# Patient Record
Sex: Female | Born: 1980 | Race: Black or African American | Hispanic: No | Marital: Single | Smoking: Current every day smoker
Health system: Southern US, Community
[De-identification: ages and names within clinical notes are randomized; demographics above are authoritative.]

## PROBLEM LIST (undated history)

## (undated) DIAGNOSIS — F909 Attention-deficit hyperactivity disorder, unspecified type: Secondary | ICD-10-CM

## (undated) DIAGNOSIS — I10 Essential (primary) hypertension: Secondary | ICD-10-CM

## (undated) DIAGNOSIS — K029 Dental caries, unspecified: Secondary | ICD-10-CM

---
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
## 1999-04-13 ENCOUNTER — Emergency Department (HOSPITAL_COMMUNITY): Admission: EM | Admit: 1999-04-13 | Discharge: 1999-04-14 | Payer: Self-pay | Admitting: Emergency Medicine

## 1999-09-01 ENCOUNTER — Emergency Department (HOSPITAL_COMMUNITY): Admission: EM | Admit: 1999-09-01 | Discharge: 1999-09-01 | Payer: Self-pay | Admitting: Emergency Medicine

## 1999-10-13 ENCOUNTER — Inpatient Hospital Stay (HOSPITAL_COMMUNITY): Admission: AD | Admit: 1999-10-13 | Discharge: 1999-10-13 | Payer: Self-pay | Admitting: *Deleted

## 1999-11-01 ENCOUNTER — Inpatient Hospital Stay (HOSPITAL_COMMUNITY): Admission: AD | Admit: 1999-11-01 | Discharge: 1999-11-01 | Payer: Self-pay | Admitting: Obstetrics and Gynecology

## 1999-11-25 ENCOUNTER — Emergency Department (HOSPITAL_COMMUNITY): Admission: EM | Admit: 1999-11-25 | Discharge: 1999-11-25 | Payer: Self-pay | Admitting: Emergency Medicine

## 1999-11-27 ENCOUNTER — Emergency Department (HOSPITAL_COMMUNITY): Admission: EM | Admit: 1999-11-27 | Discharge: 1999-11-27 | Payer: Self-pay | Admitting: Emergency Medicine

## 2000-01-09 ENCOUNTER — Emergency Department (HOSPITAL_COMMUNITY): Admission: EM | Admit: 2000-01-09 | Discharge: 2000-01-10 | Payer: Self-pay | Admitting: Emergency Medicine

## 2000-12-08 ENCOUNTER — Emergency Department (HOSPITAL_COMMUNITY): Admission: EM | Admit: 2000-12-08 | Discharge: 2000-12-08 | Payer: Self-pay | Admitting: Emergency Medicine

## 2001-01-26 ENCOUNTER — Emergency Department (HOSPITAL_COMMUNITY): Admission: EM | Admit: 2001-01-26 | Discharge: 2001-01-26 | Payer: Self-pay | Admitting: Emergency Medicine

## 2001-04-12 ENCOUNTER — Emergency Department (HOSPITAL_COMMUNITY): Admission: EM | Admit: 2001-04-12 | Discharge: 2001-04-12 | Payer: Self-pay | Admitting: *Deleted

## 2001-04-20 ENCOUNTER — Emergency Department (HOSPITAL_COMMUNITY): Admission: EM | Admit: 2001-04-20 | Discharge: 2001-04-20 | Payer: Self-pay | Admitting: Emergency Medicine

## 2002-04-04 ENCOUNTER — Emergency Department (HOSPITAL_COMMUNITY): Admission: EM | Admit: 2002-04-04 | Discharge: 2002-04-04 | Payer: Self-pay | Admitting: Emergency Medicine

## 2005-02-04 ENCOUNTER — Emergency Department (HOSPITAL_COMMUNITY): Admission: EM | Admit: 2005-02-04 | Discharge: 2005-02-04 | Payer: Self-pay | Admitting: Emergency Medicine

## 2006-02-27 ENCOUNTER — Emergency Department (HOSPITAL_COMMUNITY): Admission: EM | Admit: 2006-02-27 | Discharge: 2006-02-27 | Payer: Self-pay | Admitting: Emergency Medicine

## 2006-07-09 ENCOUNTER — Emergency Department (HOSPITAL_COMMUNITY): Admission: EM | Admit: 2006-07-09 | Discharge: 2006-07-09 | Payer: Self-pay | Admitting: Emergency Medicine

## 2006-11-23 ENCOUNTER — Inpatient Hospital Stay (HOSPITAL_COMMUNITY): Admission: AD | Admit: 2006-11-23 | Discharge: 2006-11-23 | Payer: Self-pay | Admitting: Obstetrics & Gynecology

## 2007-03-02 ENCOUNTER — Observation Stay (HOSPITAL_COMMUNITY): Admission: EM | Admit: 2007-03-02 | Discharge: 2007-03-02 | Payer: Self-pay | Admitting: Emergency Medicine

## 2008-03-13 ENCOUNTER — Inpatient Hospital Stay (HOSPITAL_COMMUNITY): Admission: AD | Admit: 2008-03-13 | Discharge: 2008-03-14 | Payer: Self-pay | Admitting: Gynecology

## 2008-03-14 ENCOUNTER — Inpatient Hospital Stay (HOSPITAL_COMMUNITY): Admission: RE | Admit: 2008-03-14 | Discharge: 2008-03-14 | Payer: Self-pay | Admitting: Obstetrics & Gynecology

## 2008-03-14 ENCOUNTER — Encounter: Payer: Self-pay | Admitting: Obstetrics & Gynecology

## 2008-04-06 ENCOUNTER — Encounter (INDEPENDENT_AMBULATORY_CARE_PROVIDER_SITE_OTHER): Payer: Self-pay | Admitting: Gynecology

## 2008-04-06 ENCOUNTER — Ambulatory Visit: Payer: Self-pay | Admitting: Obstetrics & Gynecology

## 2008-04-20 ENCOUNTER — Ambulatory Visit: Payer: Self-pay | Admitting: Obstetrics and Gynecology

## 2008-07-05 ENCOUNTER — Emergency Department (HOSPITAL_COMMUNITY): Admission: EM | Admit: 2008-07-05 | Discharge: 2008-07-05 | Payer: Self-pay | Admitting: Emergency Medicine

## 2008-12-18 ENCOUNTER — Emergency Department (HOSPITAL_COMMUNITY): Admission: EM | Admit: 2008-12-18 | Discharge: 2008-12-18 | Payer: Self-pay | Admitting: Family Medicine

## 2009-01-22 ENCOUNTER — Emergency Department (HOSPITAL_COMMUNITY): Admission: EM | Admit: 2009-01-22 | Discharge: 2009-01-22 | Payer: Self-pay | Admitting: Emergency Medicine

## 2009-09-04 ENCOUNTER — Emergency Department (HOSPITAL_COMMUNITY): Admission: EM | Admit: 2009-09-04 | Discharge: 2009-09-04 | Payer: Self-pay | Admitting: Emergency Medicine

## 2009-12-28 ENCOUNTER — Emergency Department (HOSPITAL_COMMUNITY): Admission: EM | Admit: 2009-12-28 | Discharge: 2009-12-28 | Payer: Self-pay | Admitting: Family Medicine

## 2010-01-09 ENCOUNTER — Emergency Department (HOSPITAL_COMMUNITY): Admission: EM | Admit: 2010-01-09 | Discharge: 2010-01-09 | Payer: Self-pay | Admitting: Emergency Medicine

## 2010-08-10 ENCOUNTER — Emergency Department (HOSPITAL_COMMUNITY): Admission: EM | Admit: 2010-08-10 | Discharge: 2010-08-10 | Payer: Self-pay | Admitting: Emergency Medicine

## 2010-10-03 ENCOUNTER — Emergency Department (HOSPITAL_COMMUNITY)
Admission: EM | Admit: 2010-10-03 | Discharge: 2010-10-03 | Payer: Self-pay | Source: Home / Self Care | Admitting: Emergency Medicine

## 2011-01-08 LAB — URINE MICROSCOPIC-ADD ON

## 2011-01-08 LAB — URINALYSIS, ROUTINE W REFLEX MICROSCOPIC
Bilirubin Urine: NEGATIVE
Glucose, UA: NEGATIVE mg/dL
Hgb urine dipstick: NEGATIVE
Specific Gravity, Urine: 1.017 (ref 1.005–1.030)
pH: 5.5 (ref 5.0–8.0)

## 2011-01-08 LAB — WET PREP, GENITAL
Trich, Wet Prep: NONE SEEN
Yeast Wet Prep HPF POC: NONE SEEN

## 2011-01-08 LAB — POCT PREGNANCY, URINE: Preg Test, Ur: NEGATIVE

## 2011-01-19 LAB — POCT RAPID STREP A (OFFICE): Streptococcus, Group A Screen (Direct): NEGATIVE

## 2011-03-11 NOTE — Group Therapy Note (Signed)
NAMEKAYTI, POSS NO.:  [id number]   MEDICAL RECORD NO.:  [id number]          PATIENT TYPE:  WOC   LOCATION:  [facility]                   FACILITY:  [facility]   PHYSICIAN:  Ginger Carne, MD DATE OF BIRTH:  19-May-1981   DATE OF SERVICE:  04/06/2008                                  CLINIC NOTE   Ms. Menon returns today following a maternity admission unit visit  because of a spontaneous abortion on Mar 13, 2008 under 12 weeks.  At  that time the patient had active bleeding.  Her pathology report  demonstrated significant decidua without active glands and blood and no  chorionic villi identified.   At this time the patient has stopped bleeding.  Has no specific  complaints and is here today for discussion regarding contraceptive  management and for pelvic exam.   EXTERNAL GENITALIA:  Vulva and vagina normal.  Cervix smooth without  erosions or lesions.  Uterus is small, anteverted and flexed.  Both  adnexa palpable, found to be normal.  GC, chlamydia and Pap smear  performed.   PLAN:  The patient was given information regarding a Mirena IUD, which  she has accepted to have placed.  Will be scheduled in the next 2-3  weeks for the same.  She was advised to refrain from intercourse to  avoid contraception.           ______________________________  Ginger Carne, MD     SHB/MEDQ  D:  04/06/2008  T:  04/06/2008  Job:  [id number]

## 2011-03-14 NOTE — H&P (Signed)
NAMEAIRIS, BARBEE NO.:  [id number]   MEDICAL RECORD NO.:  [id number]          PATIENT TYPE:  INP   LOCATION:  2807                         FACILITY:  [facility]   PHYSICIAN:  Armanda Magic, M.D.     DATE OF BIRTH:  1981/07/08   DATE OF ADMISSION:  03/02/2007  DATE OF DISCHARGE:                              HISTORY & PHYSICAL   No known primary care physician.  Cardiologist Corliss Marcus, MD.   REASON FOR EVALUATION:  Chest pain.   HISTORY OF PRESENT ILLNESS:  Mrs. Eades is a 30 year old African-  American female with no known history of coronary artery disease.  She  presented to the [facility] on this morning with a chief  complaint of a 1-day history of substernal nonexertional chest pain that  she described as a dull ache and constant pressure.  The pain radiated  to her bilateral arms and back.  She admitted to association shortness  of breath, nausea and vomiting x1.  There were no other associated  symptoms.  A 12-lead EKG was obtained and revealed normal sinus rhythm  with a ventricular rate of 77 beats per minute with mild ST-segment  elevation in the anterolateral leads.  She was emergently transferred to  the cardiac catheterization laboratory for further evaluation and  possible reperfusion   PAST MEDICAL HISTORY:  1. GERD.  2. Tobacco use.  3. Status post orthopedic procedure, unknown.  4. Status post cesarean section.   ALLERGIES:  NO KNOWN DRUG ALLERGIES.   CURRENT MEDICATIONS:  None.   FAMILY HISTORY:  Insignificant for early coronary artery disease.   SOCIAL HISTORY:  Single with 1 child.  Lives with her child.  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  She smokes one-half pack per day and has  been for the past 2 years.  She admits to occasional alcohol use,  however denies illicit drug use.   REVIEW OF SYSTEMS:  All other systems reviewed are negative other than  what is stated in the HPI.   GENERAL:  A 30 year old female  appearing in chest pain, NAD.  VITALS:  Pulse 88, blood pressure 127/80, respirations 21.  HEENT:  Unremarkable.  NECK: Supple without JVD or bilateral carotid bruits.  Carotid upstrokes  2+.  PULMONARY:  Breath sounds are equal and clear to auscultation  bilaterally.  No use of accessory muscles.  CV:  Regular rate and rhythm.  Normal S1 and S2 without murmurs,  gallops, clicks or rubs.  ABDOMEN: Benign.  EXTREMITIES: No peripheral edema, cyanosis, or clubbing.  DP and femoral  pulses 2+/2, bilaterally.  SKIN:  Warm and dry without rashes or lesions.  NEURO:  No focal motor or sensory deficits.  PSYCH:  Normal mood and affect.  BACK:  No kyphosis or scoliosis.   LABORATORY DATA:  White blood count 3.9, hemoglobin 12.1, hematocrit  37.6, platelets 270,000.  Sodium 136, potassium 4.2, chloride 107,  bicarbonate 21.8, BUN 7, creatinine 0.7, glucose 93, D-dimer 0.60.  Chest X-Ray: Mild chronic bronchitic changes.  Point of care enzymes:  myoglobin 56.5, CK-MB less  than 1.0, troponin I less than 0.05.   EKG as stated in the HPI.   ASSESSMENT:  1. Chest pain.  2. Dyspnea.  3. Tobacco use.   PLAN:  The patient was emergently transferred to the cardiac  catheterization laboratory for further evaluation and possible  reperfusion.  The cardiac catheterization procedure was performed by Dr.  Corliss Marcus.  The procedure, risks, and potential complications were  explained to the patient in detail by Dr. Corliss Marcus including MI,  CVA, death, IV contrast dye allergy, renal insufficiency within the  first 48 hours, and vascular complications including bleeding, hematoma  and pseudoaneurysm.  The patient admitted to understanding of the  information and wished to proceed.      Tylene Fantasia, [place]      Armanda Magic, M.D.  Electronically Signed    RDM/MEDQ  D:  03/02/2007  T:  03/02/2007  Job:  [id number]

## 2011-03-14 NOTE — Cardiovascular Report (Signed)
NAMEJENYA, Austin NO.:  [id number]   MEDICAL RECORD NO.:  [id number]          PATIENT TYPE:  INP   LOCATION:  2807                         FACILITY:  [facility]   PHYSICIAN:  Francisca December, M.D.  DATE OF BIRTH:  16-Jun-1981   DATE OF PROCEDURE:  03/02/2007  DATE OF DISCHARGE:                            CARDIAC CATHETERIZATION   PROCEDURES PERFORMED:  1. Left heart catheterization.  2. Left ventriculogram.  3. Coronary angiography.   INDICATIONS:  Diana Austin is a 30 year old woman who presented to [facility]  emergency room this morning with 0-hour history of severe chest pain.  An ECG revealed ST elevation in the lateral precordial leads.  An acute  myocardial infarction was presumed.  She is brought to the  catheterization laboratory emergently for coronary angiography and  possible PCI.   PROCEDURE NOTE:  The patient was brought to the cardiac catheterization  laboratory where the right groin was prepped and draped in the usual  sterile fashion.  Local anesthesia was obtained with infiltration of 1%  lidocaine.  A 6-French catheter sheath was inserted percutaneously into  the right femoral artery utilizing an anterior approach over guiding J-  wire.  Left heart catheterization and coronary angiography then  proceeded in standard fashion using 6-French #4 left and right Judkins  catheters and a 110 cm pigtail catheter.  A 30 RAO cine left  ventriculogram was performed utilizing a power injector.  39 cc were  injected at 13 cc per second.  The patient did develop catheter induced  spasm in the right coronary which was relieved with 0.2 mg of  intracoronary nitroglycerin.  At the completion of the procedure, the  catheter and catheter sheaths were removed.  Hemostasis was achieved by  direct pressure.  The patient was transported to the recovery area in  stable condition with an intact distal pulse.   HEMODYNAMIC RESULTS:  Systemic arterial pressure was 128/91  with a mean  of 110 mmHg.  There was no systolic gradient across the aortic valve.  Left ventricular end-diastolic pressure was 12 mmHg post ventriculogram.   ANGIOGRAPHY:  The left ventriculogram demonstrated normal chamber size  and normal global systolic function with a visually estimated ejection  fraction of 65-70%.  There were no regional wall motion abnormalities.  There was no coronary calcification.  There was no mitral vegetation.  The aortic valve is trileaflet and opens normally during systole.   There is a right-dominant coronary system present.  The main left  coronary artery is normal.   The left anterior descending artery and its branches are normal.   The left circumflex coronary artery and its branches are normal.  There  is a very large first marginal branch which is the dominant vessel on  the lateral wall of the heart.  There is a moderate-size second marginal  branch then the ongoing circumflex gives rise only to a small  posterolateral segment posterolateral branch in the basal segment of the  LV posterolateral wall.   The right coronary artery and its branches are without any significant  disease.  As mentioned, there  was some catheter induced spasm which was  eventually relieved with intracoronary nitroglycerin.  Again there is no  obstruction in the proximal, mid or distal vessel portion.  Distally the  vessel gives rise to a moderate-sized posterior descending artery and a  large posterolateral segment with a large left ventricular branch.   Collateral vessels are not seen.   FINAL IMPRESSION:  1. Intact left ventricular size and global systolic function, ejection      fraction 65 to 70%  2. Normal coronaries.  3. Noncardiac chest pain.      Francisca December, M.D.  Electronically Signed     JHE/MEDQ  D:  03/02/2007  T:  03/02/2007  Job:  [id number]

## 2011-07-23 LAB — POCT PREGNANCY, URINE
Operator id: 11897
Preg Test, Ur: POSITIVE

## 2011-07-23 LAB — CBC
HCT: 39.9
Hemoglobin: 13.4
MCHC: 33.6
MCV: 84.8
Platelets: 293
RBC: 4.52
RBC: 4.71
RDW: 18.9 — ABNORMAL HIGH
WBC: 5.5

## 2011-07-23 LAB — GC/CHLAMYDIA PROBE AMP, GENITAL
Chlamydia, DNA Probe: NEGATIVE
GC Probe Amp, Genital: NEGATIVE

## 2011-07-23 LAB — RH IMMUNE GLOBULIN WORKUP (NOT WOMEN'S HOSP)

## 2011-07-23 LAB — WET PREP, GENITAL

## 2011-07-24 LAB — POCT PREGNANCY, URINE
Operator id: 14811
Preg Test, Ur: NEGATIVE

## 2015-02-11 ENCOUNTER — Encounter (HOSPITAL_COMMUNITY): Payer: Self-pay | Admitting: Emergency Medicine

## 2015-02-11 ENCOUNTER — Emergency Department (HOSPITAL_COMMUNITY)
Admission: EM | Admit: 2015-02-11 | Discharge: 2015-02-11 | Disposition: A | Discharge status: Home / Self Care | Payer: Self-pay | Attending: Emergency Medicine | Admitting: Emergency Medicine

## 2015-02-11 DIAGNOSIS — Z720 Tobacco use: Secondary | ICD-10-CM | POA: Insufficient documentation

## 2015-02-11 DIAGNOSIS — K029 Dental caries, unspecified: Secondary | ICD-10-CM | POA: Insufficient documentation

## 2015-02-11 DIAGNOSIS — Z8659 Personal history of other mental and behavioral disorders: Secondary | ICD-10-CM | POA: Insufficient documentation

## 2015-02-11 DIAGNOSIS — I10 Essential (primary) hypertension: Secondary | ICD-10-CM | POA: Insufficient documentation

## 2015-02-11 DIAGNOSIS — K0889 Other specified disorders of teeth and supporting structures: Secondary | ICD-10-CM

## 2015-02-11 DIAGNOSIS — K088 Other specified disorders of teeth and supporting structures: Secondary | ICD-10-CM | POA: Insufficient documentation

## 2015-02-11 HISTORY — DX: Attention-deficit hyperactivity disorder, unspecified type: F90.9

## 2015-02-11 HISTORY — DX: Dental caries, unspecified: K02.9

## 2015-02-11 HISTORY — DX: Essential (primary) hypertension: I10

## 2015-02-11 MED ORDER — OXYCODONE-ACETAMINOPHEN 5-325 MG PO TABS: 2.0000 | ORAL_TABLET | ORAL | Status: AC | PRN

## 2015-02-11 MED ORDER — OXYCODONE-ACETAMINOPHEN 5-325 MG PO TABS
2.0000 | ORAL_TABLET | Freq: Once | ORAL | Status: AC
  Administered 2015-02-11: 2 via ORAL
  Filled 2015-02-11: qty 2

## 2015-02-11 NOTE — ED Notes (Signed)
C/o R sided dental pain since this morning (upper and lower).

## 2015-02-11 NOTE — ED Provider Notes (Signed)
CSN: [id number]     Arrival date & time 02/11/15  1606 History   First MD Initiated Contact with Patient 02/11/15 1801     Chief Complaint  Patient presents with  . Dental Pain     (Consider location/radiation/quality/duration/timing/severity/associated sxs/prior Treatment) Patient is a 34 y.o. female presenting with tooth pain. The history is provided by the patient. No language interpreter was used.  Dental Pain Associated symptoms: no drooling, no facial swelling and no fever   Diana Austin is a 34 y.o female who presents for gradual onset intermittent right lower tooth pain that began this morning.  She states that she placed oragel on her tooth and excederin for pain with little relief. Eating does not make it worse. She denies any fever, chills, difficulty eating, tooth injury or fracture, facial swelling, difficulty opening the jaw or jaw pain.   Past Medical History  Diagnosis Date  . Dental caries   . ADHD (attention deficit hyperactivity disorder)   . Hypertension    History reviewed. No pertinent past surgical history. No family history on file. History  Substance Use Topics  . Smoking status: Current Every Day Smoker  . Smokeless tobacco: Not on file  . Alcohol Use: No   OB History    No data available     Review of Systems  Constitutional: Negative for fever and activity change.  HENT: Positive for dental problem. Negative for drooling, ear pain, facial swelling, trouble swallowing and voice change.   Eyes: Negative for pain.  Skin: Negative for wound.      Allergies  Review of patient's allergies indicates not on file.  Home Medications   Prior to Admission medications   Medication Sig Start Date End Date Taking? Authorizing Provider  oxyCODONE-acetaminophen (PERCOCET/ROXICET) 5-325 MG per tablet Take 2 tablets by mouth every 4 (four) hours as needed for severe pain. 02/11/15   Chrstopher Malenfant Patel-Mills, PA-C   BP 159/110 mmHg  Pulse 65  Temp(Src) 98 F (36.7  C) (Oral)  Resp 20  Ht  (1.676 m)  Wt 267 lb (121.11 kg)  BMI 43.12 kg/m2  SpO2 100%  LMP 12/08/2014 Physical Exam  Constitutional: She is oriented to person, place, and time. She appears well-developed and well-nourished.  HENT:  No dental abscess. She does have several teeth with dental decay and caries but no obvious exposed pulp or dentin including #30 which is the tooth that is causing her pain.  No signs of trismus.  No difficulty opening and closing jaw.    Neck: Normal range of motion. Neck supple.  Cardiovascular: Normal rate, regular rhythm and normal heart sounds.   Pulmonary/Chest: Effort normal and breath sounds normal.  Neurological: She is alert and oriented to person, place, and time.  Skin: Skin is warm and dry.  Nursing note and vitals reviewed.   ED Course  Procedures (including critical care time) Labs Review Labs Reviewed - No data to display  Imaging Review No results found.   EKG Interpretation None      MDM   Final diagnoses:  Pain, dental  Patient presents for dental pain. She has several dental caries and tooth decay but no abscess or injury.  She has no facial swelling and she is able to eat and drink. Patient has not been to ED for dental pain in the past. I think it is reasonable to give her eight percocet for pain until she follows up tomorrow.  I did give her dental referral using the dentist  on call or the resource guide and she agrees with the plan.     Catha GosselinHanna Patel-Mills, PA-C 02/11/15 1914  Linwood DibblesJon Knapp, MD 02/14/15 [phone]

## 2015-02-11 NOTE — Discharge Instructions (Signed)
Dental Pain °A tooth ache may be caused by cavities (tooth decay). Cavities expose the nerve of the tooth to air and hot or cold temperatures. It may come from an infection or abscess (also called a boil or furuncle) around your tooth. It is also often caused by dental caries (tooth decay). This causes the pain you are having. °DIAGNOSIS  °Your caregiver can diagnose this problem by exam. °TREATMENT  °· If caused by an infection, it may be treated with medications which kill germs (antibiotics) and pain medications as prescribed by your caregiver. Take medications as directed. °· Only take over-the-counter or prescription medicines for pain, discomfort, or fever as directed by your caregiver. °· Whether the tooth ache today is caused by infection or dental disease, you should see your dentist as soon as possible for further care. °SEEK MEDICAL CARE IF: °The exam and treatment you received today has been provided on an emergency basis only. This is not a substitute for complete medical or dental care. If your problem worsens or new problems (symptoms) appear, and you are unable to meet with your dentist, call or return to this location. °SEEK IMMEDIATE MEDICAL CARE IF:  °· You have a fever. °· You develop redness and swelling of your face, jaw, or neck. °· You are unable to open your mouth. °· You have severe pain uncontrolled by pain medicine. °MAKE SURE YOU:  °· Understand these instructions. °· Will watch your condition. °· Will get help right away if you are not doing well or get worse. °Document Released: 10/13/2005 Document Revised: 01/05/2012 Document Reviewed: 05/31/2008 °ExitCare® Patient Information ©2015 [employer]. This information is not intended to replace advice given to you by your health care provider. Make sure you discuss any questions you have with your health care provider. ° °Emergency Department Resource Guide °1) Find a Doctor and Pay Out of Pocket °Although you won't have to find out who  is covered by your insurance plan, it is a good idea to ask around and get recommendations. You will then need to call the office and see if the doctor you have chosen will accept you as a new patient and what types of options they offer for patients who are self-pay. Some doctors offer discounts or will set up payment plans for their patients who do not have insurance, but you will need to ask so you aren't surprised when you get to your appointment. ° °2) Contact Your Local Health Department °Not all health departments have doctors that can see patients for sick visits, but many do, so it is worth a call to see if yours does. If you don't know where your local health department is, you can check in your phone book. The CDC also has a tool to help you locate your state's health department, and many state websites also have listings of all of their local health departments. ° °3) Find a Walk-in Clinic °If your illness is not likely to be very severe or complicated, you may want to try a walk in clinic. These are popping up all over the country in pharmacies, drugstores, and shopping centers. They're usually staffed by nurse practitioners or physician assistants that have been trained to treat common illnesses and complaints. They're usually fairly quick and inexpensive. However, if you have serious medical issues or chronic medical problems, these are probably not your best option. ° °No Primary Care Doctor: °- Call [facility] at  832-8000 - they can help you locate a primary   care doctor that  accepts your insurance, provides certain services, etc. °- Physician Referral Service- 1-800-533-3463 ° °Chronic Pain Problems: °Organization         Address  Phone   Notes  °[facility]  (336) 297-2271 Patients need to be referred by their primary care doctor.  ° °Medication Assistance: °Organization         Address  Phone   Notes  °[place] [facility] [address]  [address] °[place], [place] [address] (336) 641-8030 --Must be a resident of [place] °-- Must have NO insurance coverage whatsoever (no Medicaid/ Medicare, etc.) °-- The pt. MUST have a primary care doctor that directs their care regularly and follows them in the community °  °[facility]  (866) 331-1348   °[employer]  (888) 892-1162   ° °Agencies that provide inexpensive medical care: °Organization         Address  Phone   Notes  °[facility]  (336) 832-8035   °[facility]    (336) 832-7272   °[facility] [address] °[place], [place] [address] (336) 832-4777   °[facility] [address], °[place] (336) 271-4999   °[facility]    (336) 373-0678   °[facility]    (336) 272-1050   °[facility] ° [address], [place] Phone:  (336) 832-4444, Fax:  (336) 832-4440 Hours of Operation:  9 am - 6 pm, M-F.  Also accepts Medicaid/Medicare and self-pay.  °[facility] ° [address], [place] Phone: (336) 832-3150, Fax: (336) [phone]. Hours of Operation:  8:30 am - 5:30 pm, M-F.  Also accepts Medicaid and self-pay.  °[facility] [place] [address], [place] Phone: (336) 878-6027   °[facility] [address], [place], [place] (336)723-1848, Ext. 123 Mondays & Thursdays: 7-9 AM.  First 15 patients are seen on a first come, first serve basis. °  ° °Medicaid-accepting [place] Providers: ° °Organization         Address  Phone   Notes  °[facility] [address], [place] (336) 641-2100 Also accepts self-pay patients.  °[facility] [address], [place] ° (336) 856-9996   °[facility] [address], [place] (336) 288-8857   °[facility] [address] [place] [address], [place] (336) 299-7000   °Veita Bland [address], [place]  ° (336) 373-1557 Only accepts [place] Access Medicaid patients after they have their name applied to their card.  ° °Self-Pay (no insurance) in [place]: ° °Organization         Address  Phone   Notes  °Sickle Cell Patients, [facility] [address], [place] (336) 832-1970   °[facility] [address], [place] (336) 832-4400   ° [facility] ° [address], [place] (336) 992-4800   °[facility]/Dr. Osei-Bonsu ° [address] [place] [address], [place] or [address], [place] (336) [phone] Phone number for both [place] and [place] locations is the same.  °[facility] [address], [place] (336) 299-0000   °[facility] [address] [place] [address], [place] or [address] (336) 852-7530 °(336) 878-2260   °  [facility] [address], [place] (336) 350-1642, phone; (336) 294-5005, fax Sees patients 1st and 3rd Saturday of every month.  Must not qualify for public or private insurance (i.e. Medicaid, Medicare, [place] Health Choice, Veterans' Benefits) • Household income should be no more than 200% of the poverty level •The clinic cannot treat you if you are pregnant or think you are pregnant • Sexually transmitted diseases are not treated at the clinic.  ° ° °Dental Care: °Organization         Address  Phone  Notes  °[facility] [address], [place] (336) 641-6152 Accepts children up to age 21 who are enrolled in Medicaid or [place] Health Choice; pregnant women with a Medicaid card; and children who have applied for Medicaid or [place] Health Choice, but were declined, whose parents can pay a reduced fee at time of service.  °[facility] [place]  [address], [place] (336) 641-7733 Accepts children up to age 21 who are  enrolled in Medicaid or [place] Health Choice; pregnant women with a Medicaid card; and children who have applied for Medicaid or [place] Health Choice, but were declined, whose parents can pay a reduced fee at time of service.  °[facility] ° [address], [place] (336) 641-4533 Patients are seen by appointment only. Walk-ins are not accepted. [facility] will see patients 18 years of age and older. °Monday - Tuesday (8am-5pm) °Most Wednesdays (8:30-5pm) °$30 per visit, cash only  °[facility] ° [address], [place] (336) 641-4533 Patients are seen by appointment only. Walk-ins are not accepted. [facility] will see patients 18 years of age and older. °One Wednesday Evening (Monthly: Volunteer Based).  $30 per visit, cash only  °[facility]  (919) [phone] for adults; Children under age 4, call Graduate Pediatric Dentistry at ([phone]) [phone]. Children aged 4-14, please call ([phone]) [phone] to request a pediatric application. ° Dental services are provided in all areas of dental care including fillings, crowns and bridges, complete and partial dentures, implants, gum treatment, root canals, and extractions. Preventive care is also provided. Treatment is provided to both adults and children. °Patients are selected via a lottery and there is often a waiting list. °  °[facility] [address], °[place] ° (336) 763-8833 [email or web address] °  °[facility] [address], [place], [place] (336)723-1848, Ext. 123 Second and Fourth Thursday of each month, opens at 6:30 AM; Clinic ends at 9 AM.  Patients are seen on a first-come first-served basis, and a limited number are seen during each clinic.  ° °[facility] ° [address], [place], [place] (336) 723-7904   Eligibility Requirements °You must have lived in [place], [place], or [place] counties for at least the last three months. °  You  cannot be eligible for state or federal sponsored healthcare insurance, including Veterans Administration, Medicaid, or Medicare. °  You generally cannot be eligible for healthcare insurance through your employer.  °  How to apply: °Eligibility screenings are held every Tuesday and Wednesday afternoon from 1:00 pm until 4:00 pm. You do not need an appointment for the interview!  °[facility] [address], [place],  336-631-2330   °[facility]  336-342-8273   °[facility]  336-703-3100   °[facility]  336-570-6415   ° °Behavioral Health Resources in the Community: °Intensive Outpatient Programs °Organization         Address  Phone  Notes  °[facility] [address], [place], [place] 336-878-6098   °[facility] [address], [place], [place] 336-832-9800   °[facility] [address], [place], [place] ° 336-882-2125   °[facility] [address],  °[place], [place] 1-800-853-5163 or 336-641-4981   °Substance Abuse Resources °Organization         Address  Phone  Notes  °[facility]  336-882-2125   °[facility]  336-784-9470   °[facility]  336-285-9073   °[facility]  336-845-3988   °Residential & Outpatient Substance Abuse Program  1-800-659-3381   °Psychological Services °Organization         Address  Phone  Notes  °[facility]  336- 832-9600   °[facility]  336- 378-7881   °[facility] [address], [place] 1-800-853-5163 or 336-641-4981   ° °Mobile Crisis Teams °Organization         Address  Phone  Notes  °[facility], Mobile Crisis Care Unit  1-877-626-1772   °Assertive °[facility] ° [address] [place], [place] 336-834-9664   °Sharon DeEsch [address] °[place] 336-554-5454   ° °Self-Help/Support  Groups °Organization         Address  Phone             Notes  °[facility] - variety of support groups  336- 373-1402 Call for more information  °Narcotics Anonymous (NA), [facility] [address], °[place]  2 meetings at this location  ° °Residential Treatment Programs °Organization         Address  Phone  Notes  °[facility] [address],    °[place]  1-866-801-8205   °[facility] ° [address], [place], [place] 704-293-8524   °[facility] [address], [place] 336-845-3988 Admissions: 8am-3pm M-F  °[facility] [address]    °[place], [place] 336-841-1104   °[facility] [address], [place], [place] 336-379-7146   °[facility] [address]  °[place], [place] 336-285-9073   °[facility] - Intensive Outpatient [address], [place], [place] 336-852-3033   °[facility] [address]  °[place], [place] 1-877-615-2722 or 336-784-9470   °[facility] [address] [place], [place] 336-227-7417 Accepts Medicaid  °[facility] [address]  ° [place] 1-800-659-3381 Substance Abuse/Addiction Treatment  ° °[place] Behavioral Health Resources °Organization         Address  Phone  Notes  °[facility]  (888) 581-9988   °Julie Brannon, PhD [address] [place], [place]   (336) 349-5553 or (336) 951-0000   °[facility]   [address] °[place], [place] (336) 349-4454   °[facility] [address], [place], [place] (336) 342-8316 Insurance/Medicaid/sponsorship through [facility]  °[facility] [address]                                    [place], [place] (336) 342-8316 Therapy/tele-psych/case  °[facility]   [address]  ° [place], [place] (336) 349-2233    °Dr. Arfeen  (336) 349-4544   °[facility] [place]  [employer] [facility] [place] [facility] 1) [address], [place] °2) [address], [place] °3)  [address], [place] (336) 349-3220 °(336) 342-7768 ° °(336) 342-8140   °[place] [facility] (336) 342-1394 or (336) 342-3537 (After Hours)    ° ° ° °

## 2015-10-03 ENCOUNTER — Emergency Department (HOSPITAL_COMMUNITY)
Admission: EM | Admit: 2015-10-03 | Discharge: 2015-10-03 | Disposition: A | Discharge status: Home / Self Care | Payer: No Typology Code available for payment source | Attending: Emergency Medicine | Admitting: Emergency Medicine

## 2015-10-03 DIAGNOSIS — Y9389 Activity, other specified: Secondary | ICD-10-CM | POA: Diagnosis not present

## 2015-10-03 DIAGNOSIS — S4992XA Unspecified injury of left shoulder and upper arm, initial encounter: Secondary | ICD-10-CM | POA: Insufficient documentation

## 2015-10-03 DIAGNOSIS — Y998 Other external cause status: Secondary | ICD-10-CM | POA: Insufficient documentation

## 2015-10-03 DIAGNOSIS — M25511 Pain in right shoulder: Secondary | ICD-10-CM

## 2015-10-03 DIAGNOSIS — S8992XA Unspecified injury of left lower leg, initial encounter: Secondary | ICD-10-CM | POA: Diagnosis not present

## 2015-10-03 DIAGNOSIS — I10 Essential (primary) hypertension: Secondary | ICD-10-CM | POA: Insufficient documentation

## 2015-10-03 DIAGNOSIS — M25512 Pain in left shoulder: Secondary | ICD-10-CM

## 2015-10-03 DIAGNOSIS — Z8659 Personal history of other mental and behavioral disorders: Secondary | ICD-10-CM | POA: Insufficient documentation

## 2015-10-03 DIAGNOSIS — Y92410 Unspecified street and highway as the place of occurrence of the external cause: Secondary | ICD-10-CM | POA: Insufficient documentation

## 2015-10-03 DIAGNOSIS — F17200 Nicotine dependence, unspecified, uncomplicated: Secondary | ICD-10-CM | POA: Diagnosis not present

## 2015-10-03 DIAGNOSIS — M79602 Pain in left arm: Secondary | ICD-10-CM

## 2015-10-03 DIAGNOSIS — S199XXA Unspecified injury of neck, initial encounter: Secondary | ICD-10-CM | POA: Diagnosis not present

## 2015-10-03 DIAGNOSIS — S4991XA Unspecified injury of right shoulder and upper arm, initial encounter: Secondary | ICD-10-CM | POA: Insufficient documentation

## 2015-10-03 DIAGNOSIS — Z8719 Personal history of other diseases of the digestive system: Secondary | ICD-10-CM | POA: Insufficient documentation

## 2015-10-03 MED ORDER — METHOCARBAMOL 500 MG PO TABS: 500.0000 mg | ORAL_TABLET | Freq: Two times a day (BID) | ORAL | Status: AC | PRN

## 2015-10-03 MED ORDER — ACETAMINOPHEN 325 MG PO TABS
650.0000 mg | ORAL_TABLET | Freq: Once | ORAL | Status: AC
  Administered 2015-10-03: 650 mg via ORAL
  Filled 2015-10-03: qty 2

## 2015-10-03 MED ORDER — NAPROXEN 250 MG PO TABS: 250.0000 mg | ORAL_TABLET | Freq: Two times a day (BID) | ORAL | Status: AC
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
## 2015-10-03 NOTE — ED Notes (Signed)
Declined W/C at D/C and was escorted to lobby by RN. 

## 2016-05-21 ENCOUNTER — Encounter (HOSPITAL_COMMUNITY): Payer: Self-pay | Admitting: Emergency Medicine

## 2016-05-21 ENCOUNTER — Emergency Department (HOSPITAL_COMMUNITY)
Admission: EM | Admit: 2016-05-21 | Discharge: 2016-05-21 | Disposition: A | Discharge status: Home / Self Care | Payer: Managed Care, Other (non HMO) | Attending: Emergency Medicine | Admitting: Emergency Medicine

## 2016-05-21 DIAGNOSIS — F17200 Nicotine dependence, unspecified, uncomplicated: Secondary | ICD-10-CM | POA: Insufficient documentation

## 2016-05-21 DIAGNOSIS — I10 Essential (primary) hypertension: Secondary | ICD-10-CM | POA: Insufficient documentation

## 2016-05-21 DIAGNOSIS — R51 Headache: Secondary | ICD-10-CM | POA: Insufficient documentation

## 2016-05-21 DIAGNOSIS — R519 Headache, unspecified: Secondary | ICD-10-CM

## 2016-05-21 LAB — COMPREHENSIVE METABOLIC PANEL
ALBUMIN: 3.5 g/dL (ref 3.5–5.0)
ALK PHOS: 82 U/L (ref 38–126)
ALT: 19 U/L (ref 14–54)
ANION GAP: 5 (ref 5–15)
AST: 20 U/L (ref 15–41)
BILIRUBIN TOTAL: 0.4 mg/dL (ref 0.3–1.2)
BUN: 5 mg/dL — AB (ref 6–20)
CO2: 24 mmol/L (ref 22–32)
CREATININE: 0.88 mg/dL (ref 0.44–1.00)
Calcium: 9.1 mg/dL (ref 8.9–10.3)
Chloride: 106 mmol/L (ref 101–111)
GLUCOSE: 106 mg/dL — AB (ref 65–99)
POTASSIUM: 3.8 mmol/L (ref 3.5–5.1)
Sodium: 135 mmol/L (ref 135–145)
Total Protein: 6.9 g/dL (ref 6.5–8.1)

## 2016-05-21 LAB — CBC WITH DIFFERENTIAL/PLATELET
BASOS PCT: 0 %
Basophils Absolute: 0 10*3/uL (ref 0.0–0.1)
Eosinophils Absolute: 0.2 10*3/uL (ref 0.0–0.7)
Eosinophils Relative: 5 %
HEMATOCRIT: 43 % (ref 36.0–46.0)
HEMOGLOBIN: 14.1 g/dL (ref 12.0–15.0)
LYMPHS ABS: 1.7 10*3/uL (ref 0.7–4.0)
Lymphocytes Relative: 38 %
MCH: 28.7 pg (ref 26.0–34.0)
MCHC: 32.8 g/dL (ref 30.0–36.0)
MCV: 87.4 fL (ref 78.0–100.0)
MONO ABS: 0.3 10*3/uL (ref 0.1–1.0)
MONOS PCT: 6 %
NEUTROS ABS: 2.4 10*3/uL (ref 1.7–7.7)
NEUTROS PCT: 51 %
Platelets: 366 10*3/uL (ref 150–400)
RBC: 4.92 MIL/uL (ref 3.87–5.11)
RDW: 14 % (ref 11.5–15.5)
WBC: 4.6 10*3/uL (ref 4.0–10.5)

## 2016-05-21 MED ORDER — OXYCODONE-ACETAMINOPHEN 5-325 MG PO TABS
ORAL_TABLET | ORAL | Status: AC
  Filled 2016-05-21: qty 1

## 2016-05-21 MED ORDER — OXYCODONE-ACETAMINOPHEN 5-325 MG PO TABS
1.0000 | ORAL_TABLET | Freq: Once | ORAL | Status: AC
  Administered 2016-05-21: 1 via ORAL

## 2016-05-21 NOTE — ED Triage Notes (Signed)
Pt. Stated, yesterday I started having a headache with dizziness, and pain behind my eyes. I have high BP and its probably that.

## 2016-05-21 NOTE — ED Provider Notes (Signed)
[facility]-EMERGENCY DEPT Provider Note   CSN: [id number] Arrival date & time: 05/21/16  1409  First Provider Contact:  First MD Initiated Contact with Patient 05/21/16 1659        History   Chief Complaint Chief Complaint  Patient presents with  . Headache  . Weakness  . Dizziness    HPI Diana Austin is a 35 y.o. female.  35 yo F with CC of a headache.  Hx of migraines, feels similar.  Slow in onset. Given a percocet in triage with complete relief of symptoms. Having some tingling to hands. Has hx of similar with headaches.    The history is provided by the patient.  Headache   This is a recurrent problem. The current episode started yesterday. The problem occurs constantly. The problem has not changed since onset.The headache is associated with bright light and loud noise. The pain is located in the left unilateral region. The quality of the pain is described as sharp. The pain is at a severity of 10/10. The patient is experiencing no pain. The pain radiates to the face. Pertinent negatives include no fever, no palpitations, no shortness of breath, no nausea and no vomiting. She has tried acetaminophen for the symptoms.  Weakness  Associated symptoms include headaches. Pertinent negatives include no chest pain and no shortness of breath.  Dizziness  Associated symptoms: headaches and weakness   Associated symptoms: no chest pain, no nausea, no palpitations, no shortness of breath and no vomiting     Past Medical History:  Diagnosis Date  . ADHD (attention deficit hyperactivity disorder)   . Dental caries   . Hypertension     There are no active problems to display for this patient.   History reviewed. No pertinent surgical history.  OB History    No data available       Home Medications    Prior to Admission medications   Medication Sig Start Date End Date Taking? Authorizing Provider  naproxen (NAPROSYN) 250 MG tablet Take 1 tablet (250 mg total) by mouth 2  (two) times daily with a meal. 10/03/15  Yes Everlene Farrier, PA-C  methocarbamol (ROBAXIN) 500 MG tablet Take 1 tablet (500 mg total) by mouth 2 (two) times daily as needed for muscle spasms. 10/03/15   Everlene Farrier, PA-C  oxyCODONE-acetaminophen (PERCOCET/ROXICET) 5-325 MG per tablet Take 2 tablets by mouth every 4 (four) hours as needed for severe pain. 02/11/15   Catha Gosselin, PA-C    Family History No family history on file.  Social History Social History  Substance Use Topics  . Smoking status: Current Every Day Smoker  . Smokeless tobacco: Current User  . Alcohol use No     Allergies   Review of patient's allergies indicates no known allergies.   Review of Systems Review of Systems  Constitutional: Negative for chills and fever.  HENT: Negative for congestion and rhinorrhea.   Eyes: Negative for redness and visual disturbance.  Respiratory: Negative for shortness of breath and wheezing.   Cardiovascular: Negative for chest pain and palpitations.  Gastrointestinal: Negative for nausea and vomiting.  Genitourinary: Negative for dysuria and urgency.  Musculoskeletal: Negative for arthralgias and myalgias.  Skin: Negative for pallor and wound.  Neurological: Positive for dizziness, weakness and headaches.     Physical Exam Updated Vital Signs BP (!) 145/104 (BP Location: Right Arm)   Pulse 66   Temp 98.9 F (37.2 C) (Oral)   Resp 16   Ht  (1.676 m)  Wt 234 lb 4 oz (106.3 kg)   LMP 05/09/2016   SpO2 100%   BMI 37.81 kg/m   Physical Exam  Constitutional: She is oriented to person, place, and time. She appears well-developed and well-nourished. No distress.  HENT:  Head: Normocephalic and atraumatic.  Eyes: EOM are normal. Pupils are equal, round, and reactive to light.  Neck: Normal range of motion. Neck supple.  Cardiovascular: Normal rate and regular rhythm.  Exam reveals no gallop and no friction rub.   No murmur heard. Pulmonary/Chest: Effort  normal. She has no wheezes. She has no rales.  Abdominal: Soft. She exhibits no distension. There is no tenderness.  Musculoskeletal: She exhibits no edema or tenderness.  Neurological: She is alert and oriented to person, place, and time. She has normal strength. No cranial nerve deficit or sensory deficit. She displays a negative Romberg sign. GCS eye subscore is 4. GCS verbal subscore is 5. GCS motor subscore is 6. She displays no Babinski's sign on the right side. She displays no Babinski's sign on the left side.  Reflex Scores:      Tricep reflexes are 2+ on the right side and 2+ on the left side.      Bicep reflexes are 2+ on the right side and 2+ on the left side.      Brachioradialis reflexes are 2+ on the right side and 2+ on the left side.      Patellar reflexes are 2+ on the right side and 2+ on the left side.      Achilles reflexes are 2+ on the right side and 2+ on the left side. Benign neuro exam  Skin: Skin is warm and dry. She is not diaphoretic.  Psychiatric: She has a normal mood and affect. Her behavior is normal.  Nursing note and vitals reviewed.    ED Treatments / Results  Labs (all labs ordered are listed, but only abnormal results are displayed) Labs Reviewed  COMPREHENSIVE METABOLIC PANEL - Abnormal; Notable for the following:       Result Value   Glucose, Bld 106 (*)    BUN 5 (*)    All other components within normal limits  CBC WITH DIFFERENTIAL/PLATELET    EKG  EKG Interpretation None       Radiology No results found.  Procedures Procedures (including critical care time)  Medications Ordered in ED Medications  oxyCODONE-acetaminophen (PERCOCET/ROXICET) 5-325 MG per tablet 1 tablet (1 tablet Oral Given 05/21/16 1443)     Initial Impression / Assessment and Plan / ED Course  I have reviewed the triage vital signs and the nursing notes.  Pertinent labs & imaging results that were available during my care of the patient were reviewed by me and  considered in my medical decision making (see chart for details).  Clinical Course    35 yo F with headache, similar to prior. The patient was given a Percocet with complete resolution of symptoms. She also had some tingling in her hands that has resolved. Benign neuro exam. Discharge home.  10:29 PM:  I have discussed the diagnosis/risks/treatment options with the patient and believe the pt to be eligible for discharge home to follow-up with PCP. We also discussed returning to the ED immediately if new or worsening sx occur. We discussed the sx which are most concerning (e.g., sudden worsening pain, fever, inability to tolerate by mouth) that necessitate immediate return. Medications administered to the patient during their visit and any new prescriptions provided to the  patient are listed below.  Medications given during this visit Medications  oxyCODONE-acetaminophen (PERCOCET/ROXICET) 5-325 MG per tablet 1 tablet (1 tablet Oral Given 05/21/16 1443)    Discharge Medication List as of 05/21/2016  5:42 PM      The patient appears reasonably screen and/or stabilized for discharge and I doubt any other medical condition or other [facility] requiring further screening, evaluation, or treatment in the ED at this time prior to discharge.    Final Clinical Impressions(s) / ED Diagnoses   Final diagnoses:  Acute nonintractable headache, unspecified headache type    New Prescriptions Discharge Medication List as of 05/21/2016  5:42 PM       Melene Plan, DO 05/21/16 2229

## 2016-05-21 NOTE — Discharge Instructions (Signed)
Follow up with your doctor tomorrow.

## 2016-12-09 ENCOUNTER — Encounter (HOSPITAL_COMMUNITY): Payer: Self-pay

## 2016-12-09 ENCOUNTER — Emergency Department (HOSPITAL_COMMUNITY)
Admission: EM | Admit: 2016-12-09 | Discharge: 2016-12-09 | Disposition: A | Discharge status: Home / Self Care | Payer: 59 | Attending: Emergency Medicine | Admitting: Emergency Medicine

## 2016-12-09 ENCOUNTER — Emergency Department (HOSPITAL_COMMUNITY): Payer: 59

## 2016-12-09 DIAGNOSIS — F909 Attention-deficit hyperactivity disorder, unspecified type: Secondary | ICD-10-CM | POA: Diagnosis not present

## 2016-12-09 DIAGNOSIS — I10 Essential (primary) hypertension: Secondary | ICD-10-CM | POA: Insufficient documentation

## 2016-12-09 DIAGNOSIS — R69 Illness, unspecified: Secondary | ICD-10-CM

## 2016-12-09 DIAGNOSIS — Z79899 Other long term (current) drug therapy: Secondary | ICD-10-CM | POA: Diagnosis not present

## 2016-12-09 DIAGNOSIS — E876 Hypokalemia: Secondary | ICD-10-CM | POA: Diagnosis not present

## 2016-12-09 DIAGNOSIS — F17200 Nicotine dependence, unspecified, uncomplicated: Secondary | ICD-10-CM | POA: Insufficient documentation

## 2016-12-09 DIAGNOSIS — J111 Influenza due to unidentified influenza virus with other respiratory manifestations: Secondary | ICD-10-CM | POA: Insufficient documentation

## 2016-12-09 DIAGNOSIS — R05 Cough: Secondary | ICD-10-CM | POA: Diagnosis present

## 2016-12-09 LAB — I-STAT CHEM 8, ED
BUN: 7 mg/dL (ref 6–20)
CALCIUM ION: 1.11 mmol/L — AB (ref 1.15–1.40)
CREATININE: 0.8 mg/dL (ref 0.44–1.00)
Chloride: 100 mmol/L — ABNORMAL LOW (ref 101–111)
GLUCOSE: 127 mg/dL — AB (ref 65–99)
HCT: 35 % — ABNORMAL LOW (ref 36.0–46.0)
Hemoglobin: 11.9 g/dL — ABNORMAL LOW (ref 12.0–15.0)
Potassium: 2.7 mmol/L — CL (ref 3.5–5.1)
Sodium: 140 mmol/L (ref 135–145)
TCO2: 25 mmol/L (ref 0–100)

## 2016-12-09 LAB — BASIC METABOLIC PANEL
ANION GAP: 8 (ref 5–15)
BUN: 7 mg/dL (ref 6–20)
CO2: 27 mmol/L (ref 22–32)
Calcium: 9.1 mg/dL (ref 8.9–10.3)
Chloride: 104 mmol/L (ref 101–111)
Creatinine, Ser: 0.83 mg/dL (ref 0.44–1.00)
GFR calc Af Amer: >60 mL/min (ref >60)
GLUCOSE: 96 mg/dL (ref 65–99)
POTASSIUM: 2.9 mmol/L — AB (ref 3.5–5.1)
Sodium: 139 mmol/L (ref 135–145)

## 2016-12-09 LAB — I-STAT TROPONIN, ED: Troponin i, poc: 0 ng/mL (ref 0.00–0.08)

## 2016-12-09 MED ORDER — ACETAMINOPHEN 325 MG PO TABS
ORAL_TABLET | ORAL | Status: AC
  Filled 2016-12-09: qty 2

## 2016-12-09 MED ORDER — POTASSIUM CHLORIDE CRYS ER 20 MEQ PO TBCR
40.0000 meq | EXTENDED_RELEASE_TABLET | Freq: Once | ORAL | Status: AC
  Administered 2016-12-09: 40 meq via ORAL
  Filled 2016-12-09: qty 2

## 2016-12-09 MED ORDER — POTASSIUM CHLORIDE ER 10 MEQ PO TBCR: 20.0000 meq | EXTENDED_RELEASE_TABLET | Freq: Two times a day (BID) | ORAL | 0 refills | Status: AC

## 2016-12-09 MED ORDER — ACETAMINOPHEN 325 MG PO TABS
650.0000 mg | ORAL_TABLET | Freq: Once | ORAL | Status: AC
  Administered 2016-12-09: 650 mg via ORAL

## 2016-12-09 NOTE — ED Provider Notes (Signed)
[facility]PT Provider Note   CSN: [id number] Arrival date & time: 12/09/16  1222  By signing my name below, I, Sonum Patel, attest that this documentation has been prepared under the direction and in the presence of Alvira Monday, MD. Electronically Signed: Sonum Patel, Neurosurgeon. 12/09/16. 6:33 PM.  History   Chief Complaint Chief Complaint  Patient presents with  . Cough  . body pain    The history is provided by the patient. No language interpreter was used.     HPI Comments: Diana Austin is a 36 y.o. female who presents to the Emergency Department complaining of a gradual onset, persistent, gradually worsening non-productive cough with associated sore throat, ear pain, mild fever, chills, and SOB that began 1 day ago. She tried cough drops, Nyquil, and hot toddy without relief. She notes CP that is present with coughing fits. She denies lower leg pain, lower leg swelling, nausea, vomiting, diarrhea, abdominal pain, HA.  Reports myalgias.   Past Medical History:  Diagnosis Date  . ADHD (attention deficit hyperactivity disorder)   . Dental caries   . Hypertension     There are no active problems to display for this patient.   History reviewed. No pertinent surgical history.  OB History    No data available       Home Medications    Prior to Admission medications   Medication Sig Start Date End Date Taking? Authorizing Provider  methocarbamol (ROBAXIN) 500 MG tablet Take 1 tablet (500 mg total) by mouth 2 (two) times daily as needed for muscle spasms. 10/03/15   Everlene Farrier, PA-C  naproxen (NAPROSYN) 250 MG tablet Take 1 tablet (250 mg total) by mouth 2 (two) times daily with a meal. 10/03/15   Everlene Farrier, PA-C  oxyCODONE-acetaminophen (PERCOCET/ROXICET) 5-325 MG per tablet Take 2 tablets by mouth every 4 (four) hours as needed for severe pain. 02/11/15   Hanna Patel-Mills, PA-C  potassium chloride (K-DUR) 10 MEQ tablet Take 2 tablets (20 mEq total) by mouth  2 (two) times daily. Take (4 tablets) tonight, followed by 2 tablets twice per day for 1 day. 12/09/16 12/11/16  Alvira Monday, MD    Family History No family history on file.  Social History Social History  Substance Use Topics  . Smoking status: Current Every Day Smoker  . Smokeless tobacco: Current User  . Alcohol use No     Allergies   Patient has no known allergies.   Review of Systems Review of Systems  Constitutional: Positive for chills and fever.  HENT: Positive for ear pain and sore throat.   Eyes: Negative for visual disturbance.  Respiratory: Positive for cough and shortness of breath.   Cardiovascular: Positive for chest pain. Negative for leg swelling.  Gastrointestinal: Negative for abdominal pain, diarrhea, nausea and vomiting.  Genitourinary: Negative for difficulty urinating.  Musculoskeletal: Positive for myalgias. Negative for back pain and neck pain.  Skin: Negative for rash.  Neurological: Negative for syncope and headaches.     Physical Exam Updated Vital Signs BP 122/87 (BP Location: Right Arm)   Pulse 87   Temp 100.5 F (38.1 C) (Oral)   Resp 16   LMP 11/25/2016   SpO2 100%   Physical Exam  Constitutional: She is oriented to person, place, and time. She appears well-developed and well-nourished. No distress.  HENT:  Head: Normocephalic and atraumatic.  Right Ear: External ear normal.  Left Ear: External ear normal.  Nose: Nose normal.  Mouth/Throat: Oropharynx is clear and  moist. No oropharyngeal exudate.  Eyes: Conjunctivae and EOM are normal. Pupils are equal, round, and reactive to light.  Neck: Normal range of motion. Neck supple.  Cardiovascular: Normal rate, regular rhythm, normal heart sounds and intact distal pulses.  Exam reveals no gallop and no friction rub.   No murmur heard. Pulmonary/Chest: Effort normal and breath sounds normal. No respiratory distress. She has no wheezes. She has no rales. She exhibits tenderness.    Abdominal: Soft. She exhibits no distension. There is no tenderness. There is no rebound and no guarding.  Musculoskeletal: She exhibits no edema or tenderness.  Neurological: She is alert and oriented to person, place, and time.  Skin: Skin is warm and dry. No rash noted. She is not diaphoretic. No erythema.  Nursing note and vitals reviewed.    ED Treatments / Results  DIAGNOSTIC STUDIES: Oxygen Saturation is 99% on RA, normal by my interpretation.    COORDINATION OF CARE: 2:48 PM Discussed treatment plan with pt at bedside and pt agreed to plan.   Labs (all labs ordered are listed, but only abnormal results are displayed) Labs Reviewed  BASIC METABOLIC PANEL - Abnormal; Notable for the following:       Result Value   Potassium 2.9 (*)    All other components within normal limits  I-STAT CHEM 8, ED - Abnormal; Notable for the following:    Potassium 2.7 (*)    Chloride 100 (*)    Glucose, Bld 127 (*)    Calcium, Ion 1.11 (*)    Hemoglobin 11.9 (*)    HCT 35.0 (*)    All other components within normal limits  I-STAT TROPOININ, ED    EKG  EKG Interpretation  Date/Time:  Tuesday December 09 2016 15:18:52 EST Ventricular Rate:  92 PR Interval:    QRS Duration: 77 QT Interval:  365 QTC Calculation: 452 R Axis:   58 Text Interpretation:  Sinus rhythm Borderline T abnormalities, diffuse leads Borderline T wave abnormalities new from prior, rate has slowed since prior Confirmed by [facility] MD, Earvin Blazier ([id number]) on 12/09/2016 3:31:25 PM       Radiology Dg Chest 2 View  Result Date: 12/09/2016 CLINICAL DATA:  Cough and body aches.  Fever EXAM: CHEST  2 VIEW COMPARISON:  None. FINDINGS: The heart size and mediastinal contours are within normal limits. Both lungs are clear. The visualized skeletal structures are unremarkable. IMPRESSION: No active cardiopulmonary disease. Electronically Signed   By: Signa Kell M.D.   On: 12/09/2016 13:09    Procedures Procedures  (including critical care time)  Medications Ordered in ED Medications  acetaminophen (TYLENOL) 325 MG tablet (not administered)  potassium chloride SA (K-DUR,KLOR-CON) CR tablet 40 mEq (not administered)  acetaminophen (TYLENOL) tablet 650 mg (650 mg Oral Given 12/09/16 1232)     Initial Impression / Assessment and Plan / ED Course  I have reviewed the triage vital signs and the nursing notes.  Pertinent labs & imaging results that were available during my care of the patient were reviewed by me and considered in my medical decision making (see chart for details).     35 year old female with a history of hypertension presents with cough, congestion, sore throat, body aches. Chest x-ray shows no sign pneumonia. Patient febrile with mild tachycardia, likely related to this on exam. She reports chest pain just with coughing, has significant chest wall tenderness, and suspect this is likely muscle strain secondary to coughing. EKG shows nonspecitic TW changes which are  new.  Troponin done give these changes and negative and have low suspicion that these symptoms represent ACS.  Have low suspicion for pulmonary embolus in setting of other flulike symptoms and chest wall tenderness.  Istat Chem 8 shows K of 2.7.  Repeat BMP ordered to check accuracy.  Pt given 40meq po potassium. BMP shows K of 2.9, likely in setting of decreased po intake.  Will give rx for potassium for tonight and tomorrow, recommend recheck with PCP.  Discussed possibility with Tamiflu with patient, however given no other risk factors, minimal benefit and cough, feel supportive care is appropriate.  Patient discharged in stable condition with understanding of reasons to return.   Final Clinical Impressions(s) / ED Diagnoses   Final diagnoses:  Influenza-like illness  Hypokalemia    New Prescriptions New Prescriptions   POTASSIUM CHLORIDE (K-DUR) 10 MEQ TABLET    Take 2 tablets (20 mEq total) by mouth 2 (two) times daily.  Take 40meq (4 tablets) tonight, followed by 2 tablets twice per day for 1 day.   I personally performed the services described in this documentation, which was scribed in my presence. The recorded information has been reviewed and is accurate.    Alvira MondayErin Haelyn Forgey, MD 12/09/16 [phone]

## 2016-12-09 NOTE — ED Triage Notes (Signed)
Patient complains of cough x 1 day with fever and body aches. Has been taking otc medication with no relief, NAD

## 2017-08-05 ENCOUNTER — Emergency Department (HOSPITAL_COMMUNITY)
Admission: EM | Admit: 2017-08-05 | Discharge: 2017-08-05 | Disposition: A | Discharge status: Home / Self Care | Payer: Managed Care, Other (non HMO) | Source: Home / Self Care

## 2017-08-05 ENCOUNTER — Encounter (HOSPITAL_COMMUNITY): Payer: Self-pay | Admitting: Emergency Medicine

## 2017-08-05 ENCOUNTER — Emergency Department (HOSPITAL_COMMUNITY)
Admission: EM | Admit: 2017-08-05 | Discharge: 2017-08-05 | Disposition: A | Discharge status: Home / Self Care | Payer: Managed Care, Other (non HMO) | Attending: Emergency Medicine | Admitting: Emergency Medicine

## 2017-08-05 DIAGNOSIS — K0889 Other specified disorders of teeth and supporting structures: Secondary | ICD-10-CM | POA: Insufficient documentation

## 2017-08-05 DIAGNOSIS — Z5321 Procedure and treatment not carried out due to patient leaving prior to being seen by health care provider: Secondary | ICD-10-CM | POA: Insufficient documentation

## 2017-08-05 NOTE — ED Notes (Signed)
Pt came to this EMT and asked how long wait is. This EMT informed her of how many pts are ahead of her. Pt simply walked away / walked out of the building. RN informed.

## 2017-08-05 NOTE — ED Triage Notes (Signed)
Pt presents to ED for assessment of right upper dental pain starting Monday.  Patient states she was seen by her dentist last Monday and given a penicillin prescription.  She was then supposed to follow up with an oral surgeon.  Pt states they gave her ibuprofen at the dentist for pain and is requesting something stronger.  Last dose of Ibuprofen 45 minutes ago.

## 2017-08-05 NOTE — ED Notes (Signed)
Pt asked about wait times  When informed about wait times pt states she would just go to the dentist in the morning

## 2018-06-08 ENCOUNTER — Encounter (HOSPITAL_COMMUNITY): Payer: Self-pay | Admitting: Emergency Medicine

## 2018-06-08 ENCOUNTER — Ambulatory Visit (HOSPITAL_COMMUNITY)
Admission: EM | Admit: 2018-06-08 | Discharge: 2018-06-08 | Disposition: A | Discharge status: Home / Self Care | Payer: 59 | Attending: Family Medicine | Admitting: Family Medicine

## 2018-06-08 DIAGNOSIS — N946 Dysmenorrhea, unspecified: Secondary | ICD-10-CM

## 2018-06-08 DIAGNOSIS — R5383 Other fatigue: Secondary | ICD-10-CM | POA: Diagnosis not present

## 2018-06-08 DIAGNOSIS — R030 Elevated blood-pressure reading, without diagnosis of hypertension: Secondary | ICD-10-CM

## 2018-06-08 DIAGNOSIS — R42 Dizziness and giddiness: Secondary | ICD-10-CM

## 2018-06-08 LAB — POCT I-STAT, CHEM 8
BUN: 8 mg/dL (ref 6–20)
CALCIUM ION: 1.18 mmol/L (ref 1.15–1.40)
CHLORIDE: 107 mmol/L (ref 98–111)
Creatinine, Ser: 1 mg/dL (ref 0.44–1.00)
GLUCOSE: 99 mg/dL (ref 70–99)
HCT: 38 % (ref 36.0–46.0)
Hemoglobin: 12.9 g/dL (ref 12.0–15.0)
Potassium: 3.9 mmol/L (ref 3.5–5.1)
Sodium: 140 mmol/L (ref 135–145)
TCO2: 22 mmol/L (ref 22–32)

## 2018-06-08 NOTE — ED Provider Notes (Signed)
[facility]   [id number] 06/08/18 Arrival Time: 1144   SUBJECTIVE:  Diana Austin is a 37 y.o. female who presents with complaints of dysmenorrhea x 2 days.  Last menstrual cycle was 2 weeks ago.  Patient reports using 4 super tampons yesterday and 2 super tampons today. Concerned she is anemic. Last sexual activity was 3 months ago.  Also notes associated intermittent abdominal cramping that she rates as a 2/10.  Has tried OTC medications for menstrual relief with improvement in symptoms. Denies aggravating factors.  She reports similar symptoms in the past with irregular periods.  Denies symptoms in the past with IUD, which was removed 2 years ago.   Complains of associated nausea, fatigue, lightheadedness, and eating ice.  She denies fever, vomiting, changes in bowel or bladder habits, vaginal itching, vaginal odor, dyspareunia, vaginal rashes or lesions  Patient's last menstrual period was 06/06/2018.  ROS: As per HPI.  Past Medical History:  Diagnosis Date  . ADHD (attention deficit hyperactivity disorder)   . Dental caries   . Hypertension    History reviewed. No pertinent surgical history. No Known Allergies No current facility-administered medications on file prior to encounter.    Current Outpatient Medications on File Prior to Encounter  Medication Sig Dispense Refill  . methocarbamol (ROBAXIN) 500 MG tablet Take 1 tablet (500 mg total) by mouth 2 (two) times daily as needed for muscle spasms. 20 tablet 0  . naproxen (NAPROSYN) 250 MG tablet Take 1 tablet (250 mg total) by mouth 2 (two) times daily with a meal. 30 tablet 0  . oxyCODONE-acetaminophen (PERCOCET/ROXICET) 5-325 MG per tablet Take 2 tablets by mouth every 4 (four) hours as needed for severe pain. 8 tablet 0  . potassium chloride (K-DUR) 10 MEQ tablet Take 2 tablets (20 mEq total) by mouth 2 (two) times daily. Take 40meq (4 tablets) tonight, followed by 2 tablets twice per day for 1 day. 8 tablet 0      Social History   Socioeconomic History  . Marital status: Single    Spouse name: Not on file  . Number of children: Not on file  . Years of education: Not on file  . Highest education level: Not on file  Occupational History  . Not on file  Social Needs  . Financial resource strain: Not on file  . Food insecurity:    Worry: Not on file    Inability: Not on file  . Transportation needs:    Medical: Not on file    Non-medical: Not on file  Tobacco Use  . Smoking status: Current Every Day Smoker  . Smokeless tobacco: Current User  Substance and Sexual Activity  . Alcohol use: No  . Drug use: No  . Sexual activity: Not on file  Lifestyle  . Physical activity:    Days per week: Not on file    Minutes per session: Not on file  . Stress: Not on file  Relationships  . Social connections:    Talks on phone: Not on file    Gets together: Not on file    Attends religious service: Not on file    Active member of club or organization: Not on file    Attends meetings of clubs or organizations: Not on file    Relationship status: Not on file  . Intimate partner violence:    Fear of current or ex partner: Not on file    Emotionally abused: Not on file    Physically abused: Not  on file    Forced sexual activity: Not on file  Other Topics Concern  . Not on file  Social History Narrative  . Not on file   No family history on file.  OBJECTIVE:  Vitals:   06/08/18 1203 06/08/18 1250  BP: (!) 168/112 (!) 168/116  Pulse: 71   Resp: 16   Temp: 98.9 F (37.2 C)   TempSrc: Oral   SpO2: 99%      General appearance: alert, cooperative, appears stated age and no distress Throat: lips, mucosa, and tongue normal; teeth and gums normal Lungs: CTA bilaterally without adventitious breath sounds Heart: regular rate and rhythm.  Radial pulses 2+ symmetrical bilaterally Back: no CVA tenderness Abdomen: soft, mild lower abdominal tenderness with deep palpation; bowel sounds normal; no  masses or organomegaly; no guarding or rebound tenderness Skin: warm and dry Neurologic: CN 2-12 grossly intact; ambulates without difficulty; Finger to nose without difficulty, strength and sensation intact and symmetrical about the upper and lower extremities Psychological:  Alert and cooperative. Normal mood and affect.  Results for orders placed or performed during the hospital encounter of 06/08/18  I-STAT, chem 8  Result Value Ref Range   Sodium 140 135 - 145 mmol/L   Potassium 3.9 3.5 - 5.1 mmol/L   Chloride 107 98 - 111 mmol/L   BUN 8 6 - 20 mg/dL   Creatinine, Ser 1.611.00 0.44 - 1.00 mg/dL   Glucose, Bld 99 70 - 99 mg/dL   Calcium, Ion 0.961.18 0.451.15 - 1.40 mmol/L   TCO2 22 22 - 32 mmol/L   Hemoglobin 12.9 12.0 - 15.0 g/dL   HCT 40.938.0 81.136.0 - 91.446.0 %    Labs Reviewed  POCT I-STAT, CHEM 8    ASSESSMENT & PLAN:  1. Dysmenorrhea   2. Elevated blood pressure reading     No orders of the defined types were placed in this encounter.   Pending: Labs Reviewed  POCT I-STAT, CHEM 8   Your blood work was within normal limits.  It did not show anemia Push fluids and get rest Continue OTC medications as needed for symptomatic relief Follow up with gynecology in the near future for further evaluation and management of irregular cycles Return or go to the ED if you have any new or worsening symptoms  Elevated blood pressure.  Please follow up with PCP for further evaluation and management  Reviewed expectations re: course of current medical issues. Questions answered. Outlined signs and symptoms indicating need for more acute intervention. Patient verbalized understanding. After Visit Summary given.       Rennis HardingWurst, Arran Fessel, PA-C 06/08/18 1253

## 2018-06-08 NOTE — Discharge Instructions (Addendum)
Your blood work was within normal limits.  It did not show anemia Push fluids and get rest Continue OTC medications as needed for symptomatic relief Follow up with gynecology in the near future for further evaluation and management of irregular cycles Return or go to the ED if you have any new or worsening symptoms  Elevated blood pressure.  Please follow up with PCP for further evaluation and management

## 2018-06-08 NOTE — ED Triage Notes (Signed)
PT had her menstrual 2 weeks ago. Started again on Sunday. PT reports heavy bleeding yesterday. PT used 4 tampons yesterday. PT reports dizziness and lightheadedness that started yesterday as well.

## 2018-08-17 ENCOUNTER — Encounter (HOSPITAL_COMMUNITY): Payer: Self-pay | Admitting: *Deleted

## 2018-08-17 ENCOUNTER — Ambulatory Visit (HOSPITAL_COMMUNITY)
Admission: EM | Admit: 2018-08-17 | Discharge: 2018-08-17 | Disposition: A | Discharge status: Home / Self Care | Payer: 59 | Attending: Family Medicine | Admitting: Family Medicine

## 2018-08-17 ENCOUNTER — Other Ambulatory Visit: Payer: Self-pay

## 2018-08-17 DIAGNOSIS — R52 Pain, unspecified: Secondary | ICD-10-CM | POA: Diagnosis not present

## 2018-08-17 NOTE — Discharge Instructions (Addendum)
It was nice meeting you!!  I believe the body aches are related to your body fighting off some viral illness Keep monitoring for worsening symptoms Continue to take your daily vitamins and make sure you are staying hydrated with plenty of water Monitor your intake of potassium and try to increase that due to it being low in the past Follow up as needed for continued or worsening symptoms

## 2018-08-17 NOTE — ED Provider Notes (Signed)
[facility]    CSN: [id number] Arrival date & time: 08/17/18  0759     History   Chief Complaint Chief Complaint  Patient presents with  . Generalized Body Aches    HPI Diana Austin is a 37 y.o. female.   Patient is a 37 year old female past medical history of hypertension that presents with 1 day of generalized body aches.  Reports that she woke up this morning with a migraine and pain radiating down her entire back.  She took 2 Excedrin Migraine which helped with her headache.  She continues to have body aches.  She denies any cough, congestion, runny nose, fever, ear pain, sore throat.  She has had some chills.  She denies any nausea, vomiting, diarrhea, abdominal pain, chest pain, shortness of breath.  She denies any recent traveling or sick contacts.  ROS per HPI      Past Medical History:  Diagnosis Date  . ADHD (attention deficit hyperactivity disorder)   . Dental caries   . Hypertension     There are no active problems to display for this patient.   Past Surgical History:  Procedure Laterality Date  . CESAREAN SECTION      OB History   None      Home Medications    Prior to Admission medications   Medication Sig Start Date End Date Taking? Authorizing Provider  methocarbamol (ROBAXIN) 500 MG tablet Take 1 tablet (500 mg total) by mouth 2 (two) times daily as needed for muscle spasms. 10/03/15   Everlene Farrier, PA-C  naproxen (NAPROSYN) 250 MG tablet Take 1 tablet (250 mg total) by mouth 2 (two) times daily with a meal. 10/03/15   Everlene Farrier, PA-C  oxyCODONE-acetaminophen (PERCOCET/ROXICET) 5-325 MG per tablet Take 2 tablets by mouth every 4 (four) hours as needed for severe pain. 02/11/15   Patel-Mills, Lorelle Formosa, PA-C  potassium chloride (K-DUR) 10 MEQ tablet Take 2 tablets (20 mEq total) by mouth 2 (two) times daily. Take (4 tablets) tonight, followed by 2 tablets twice per day for 1 day. 12/09/16 12/11/16  Alvira Monday, MD     Family History No family history on file.  Social History Social History   Tobacco Use  . Smoking status: Current Every Day Smoker  . Smokeless tobacco: Current User  Substance Use Topics  . Alcohol use: Yes  . Drug use: No     Allergies   Patient has no known allergies.   Review of Systems Review of Systems   Physical Exam Triage Vital Signs ED Triage Vitals  Enc Vitals Group     BP 08/17/18 0817 (!) 150/108     Pulse Rate 08/17/18 0817 89     Resp 08/17/18 0817 18     Temp 08/17/18 0817 97.9 F (36.6 C)     Temp Source 08/17/18 0817 Oral     SpO2 08/17/18 0817 98 %     Weight --      Height --      Head Circumference --      Peak Flow --      Pain Score 08/17/18 0818 5     Pain Loc --      Pain Edu? --      Excl. in GC? --    No data found.  Updated Vital Signs BP (!) 150/108 (BP Location: Left Arm)   Pulse 89   Temp 97.9 F (36.6 C) (Oral)   Resp 18   LMP 07/19/2018 (Exact Date)  SpO2 98%   Visual Acuity Right Eye Distance:   Left Eye Distance:   Bilateral Distance:    Right Eye Near:   Left Eye Near:    Bilateral Near:     Physical Exam  Constitutional: She appears well-developed and well-nourished.  Very pleasant. Non toxic or ill appearing.   HENT:  Head: Normocephalic and atraumatic.  Right Ear: External ear normal.  Left Ear: External ear normal.  Nose: Nose normal.  Mouth/Throat: Oropharynx is clear and moist.  Eyes: Conjunctivae are normal.  Neck: Normal range of motion.  Cardiovascular: Normal rate, regular rhythm and normal heart sounds.  Pulmonary/Chest: Effort normal and breath sounds normal.  Lungs clear in all fields. No dyspnea or distress. No retractions or nasal flaring.   Musculoskeletal: Normal range of motion.  Lymphadenopathy:    She has no cervical adenopathy.  Neurological: She is alert.  Skin: Skin is warm and dry. No rash noted. No erythema. No pallor.  Psychiatric: She has a normal mood and affect.   Nursing note and vitals reviewed.    UC Treatments / Results  Labs (all labs ordered are listed, but only abnormal results are displayed) Labs Reviewed - No data to display  EKG None  Radiology No results found.  Procedures Procedures (including critical care time)  Medications Ordered in UC Medications - No data to display  Initial Impression / Assessment and Plan / UC Course  I have reviewed the triage vital signs and the nursing notes.  Pertinent labs & imaging results that were available during my care of the patient were reviewed by me and considered in my medical decision making (see chart for details).    VSS, non toxic or ill appearing Exam normal. Most likely viral illness She has no other symptoms besides body aches.  She did have a migraine but this was relieved with Excedrin.  Instructed to monitor symptoms and let us know if getting worse. Stay hydrated and increase potassium. She has a hx of low potassium.    Final Clinical Impressions(s) / UC Diagnoses   Final diagnoses:  Body aches     Discharge Instructions     It was nice meeting you!!  I believe the body aches are related to your body fighting off some viral illness Keep monitoring for worsening symptoms Continue to take your daily vitamins and make sure you are staying hydrated with plenty of water Monitor your intake of potassium and try to increase that due to it being low in the past Follow up as needed for continued or worsening symptoms     ED Prescriptions    None     Controlled Substance Prescriptions [place] Controlled Substance Registry consulted? Not Applicable   Janace Aris, NP 08/17/18 [phone]

## 2018-08-17 NOTE — ED Triage Notes (Signed)
States she went to bed last pm woke up approx. 2am with migraine took medication went back to sleep , went to work this am , c/o chills  And aching all over.

## 2018-10-07 ENCOUNTER — Other Ambulatory Visit: Payer: Self-pay

## 2018-10-07 ENCOUNTER — Ambulatory Visit (HOSPITAL_COMMUNITY)
Admission: EM | Admit: 2018-10-07 | Discharge: 2018-10-07 | Disposition: A | Discharge status: Home / Self Care | Payer: Self-pay | Attending: Family Medicine | Admitting: Family Medicine

## 2018-10-07 ENCOUNTER — Encounter (HOSPITAL_COMMUNITY): Payer: Self-pay

## 2018-10-07 DIAGNOSIS — Z79899 Other long term (current) drug therapy: Secondary | ICD-10-CM | POA: Insufficient documentation

## 2018-10-07 DIAGNOSIS — I10 Essential (primary) hypertension: Secondary | ICD-10-CM | POA: Insufficient documentation

## 2018-10-07 DIAGNOSIS — M545 Low back pain, unspecified: Secondary | ICD-10-CM

## 2018-10-07 DIAGNOSIS — F17200 Nicotine dependence, unspecified, uncomplicated: Secondary | ICD-10-CM | POA: Insufficient documentation

## 2018-10-07 LAB — POCT URINALYSIS DIP (DEVICE)
Bilirubin Urine: NEGATIVE
Glucose, UA: NEGATIVE mg/dL
Leukocytes, UA: NEGATIVE
Nitrite: NEGATIVE
Protein, ur: 100 mg/dL — AB
Specific Gravity, Urine: >=1.03 (ref 1.005–1.030)
Urobilinogen, UA: 1 mg/dL (ref 0.0–1.0)
pH: 5.5 (ref 5.0–8.0)

## 2018-10-07 LAB — POCT PREGNANCY, URINE: Preg Test, Ur: NEGATIVE

## 2018-10-07 NOTE — ED Notes (Signed)
Patient able to ambulate independently  

## 2018-10-07 NOTE — ED Provider Notes (Signed)
[facility]    CSN: [id number] Arrival date & time: 10/07/18  1324     History   Chief Complaint Chief Complaint  Patient presents with  . Back Pain    HPI Diana Austin is a 37 y.o. female.   Patient is a 37 year old female presents with lower back pain has been present for approximate 4 days.  The pain is been constant and worsening today.  She describes it as a dull ache and is sharp and stabbing at times.  She did start her menstrual cycle this morning.  She also works 10-hour shifts and stands on her feet all day and is very limited to the amount of fluid she can drink.  She denies any associated nausea, vomiting, diarrhea, fevers, body aches, chills.  She did have some vaginal discharge prior to the back pain starting.  She is currently sexually active with one partner protected with condoms.  She is unsure of STDs.  Denies any injuries to the back, strenuous exercise or heavy lifting.  She denies any associated numbness, tingling, radiation of pain, saddle paresthesias.  She has been taking over-the-counter Pamprin with relief of symptoms.  ROS per HPI      Past Medical History:  Diagnosis Date  . ADHD (attention deficit hyperactivity disorder)   . Dental caries   . Hypertension     There are no active problems to display for this patient.   Past Surgical History:  Procedure Laterality Date  . CESAREAN SECTION      OB History   No obstetric history on file.      Home Medications    Prior to Admission medications   Medication Sig Start Date End Date Taking? Authorizing Provider  methocarbamol (ROBAXIN) 500 MG tablet Take 1 tablet (500 mg total) by mouth 2 (two) times daily as needed for muscle spasms. 10/03/15   Everlene Farrieransie, William, PA-C  naproxen (NAPROSYN) 250 MG tablet Take 1 tablet (250 mg total) by mouth 2 (two) times daily with a meal. 10/03/15   Everlene Farrieransie, William, PA-C  oxyCODONE-acetaminophen (PERCOCET/ROXICET) 5-325 MG per tablet Take 2 tablets  by mouth every 4 (four) hours as needed for severe pain. 02/11/15   Patel-Mills, Lorelle FormosaHanna, PA-C  potassium chloride (K-DUR) 10 MEQ tablet Take 2 tablets (20 mEq total) by mouth 2 (two) times daily. Take 40meq (4 tablets) tonight, followed by 2 tablets twice per day for 1 day. 12/09/16 12/11/16  Alvira MondaySchlossman, Erin, MD    Family History History reviewed. No pertinent family history.  Social History Social History   Tobacco Use  . Smoking status: Current Every Day Smoker  . Smokeless tobacco: Current User  Substance Use Topics  . Alcohol use: Yes  . Drug use: No     Allergies   Patient has no known allergies.   Review of Systems Review of Systems   Physical Exam Triage Vital Signs ED Triage Vitals  Enc Vitals Group     BP 10/07/18 1352 (!) 142/94     Pulse Rate 10/07/18 1352 90     Resp 10/07/18 1352 18     Temp 10/07/18 1352 98.7 F (37.1 C)     Temp Source 10/07/18 1352 Oral     SpO2 10/07/18 1352 100 %     Weight 10/07/18 1353 118 lb (53.5 kg)     Height --      Head Circumference --      Peak Flow --      Pain Score 10/07/18 1353  7     Pain Loc --      Pain Edu? --      Excl. in GC? --    No data found.  Updated Vital Signs BP (!) 142/94 (BP Location: Right Arm)   Pulse 90   Temp 98.7 F (37.1 C) (Oral)   Resp 18   Wt 118 lb (53.5 kg)   LMP 10/07/2018   SpO2 100%   BMI 19.05 kg/m   Visual Acuity Right Eye Distance:   Left Eye Distance:   Bilateral Distance:    Right Eye Near:   Left Eye Near:    Bilateral Near:     Physical Exam Vitals signs and nursing note reviewed.  Constitutional:      Appearance: Normal appearance.  HENT:     Head: Normocephalic.  Neck:     Musculoskeletal: Normal range of motion.  Pulmonary:     Effort: Pulmonary effort is normal.  Abdominal:     General: Bowel sounds are normal.     Palpations: Abdomen is soft.     Tenderness: There is no abdominal tenderness.  Musculoskeletal: Normal range of motion.     Comments:  Tender to palpation over right lumbar paravertebral muscles.  No swelling, bruising or erythema.  No CVA tenderness.  Skin:    General: Skin is warm and dry.  Neurological:     Mental Status: She is alert.  Psychiatric:        Mood and Affect: Mood normal.      UC Treatments / Results  Labs (all labs ordered are listed, but only abnormal results are displayed) Labs Reviewed  POCT URINALYSIS DIP (DEVICE) - Abnormal; Notable for the following components:      Result Value   Ketones, ur TRACE (*)    Hgb urine dipstick LARGE (*)    Protein, ur 100 (*)    All other components within normal limits  POC URINE PREG, ED  POCT PREGNANCY, URINE  URINE CYTOLOGY ANCILLARY ONLY    EKG None  Radiology No results found.  Procedures Procedures (including critical care time)  Medications Ordered in UC Medications - No data to display  Initial Impression / Assessment and Plan / UC Course  I have reviewed the triage vital signs and the nursing notes.  Pertinent labs & imaging results that were available during my care of the patient were reviewed by me and considered in my medical decision making (see chart for details).     Urine was negative for infection Large hemoglobin most likely due to menstrual cycle. Differentials include dysmenorrhea, kidney stone, musculoskeletal back pain. She is not having any CVA tenderness.  No acute abdomen. Most likely the pain is related to her menstrual cycle.  The Pamprin has been helping with the symptoms Instructed to continue using this Also instructed to make sure she is staying hydrated and drinking plenty of water Urine being sent for cytology to rule out any STDs, BV or yeast. Instructed to monitor for continued or worsening symptoms   Final Clinical Impressions(s) / UC Diagnoses   Final diagnoses:  Acute right-sided low back pain without sciatica     Discharge Instructions     Urine was negative for infection Most likely the  pain is coming from your menstrual cycle We will send the urine for cytology to check for any infection that could be causing the pain If your back pain becomes more severe please go to the ER.     ED  Prescriptions    None     Controlled Substance Prescriptions [place] Controlled Substance Registry consulted? Not Applicable   Janace Aris, NP 10/07/18 [phone]

## 2018-10-07 NOTE — Discharge Instructions (Addendum)
Urine was negative for infection Most likely the pain is coming from your menstrual cycle We will send the urine for cytology to check for any infection that could be causing the pain If your back pain becomes more severe please go to the ER.

## 2018-10-07 NOTE — ED Triage Notes (Signed)
Pt cc lower back pain. Pt states she has not been drinking water. Pt states she's voiding a little at a time. X 1 week

## 2018-10-08 LAB — URINE CYTOLOGY ANCILLARY ONLY
Chlamydia: NEGATIVE
Neisseria Gonorrhea: NEGATIVE
Trichomonas: NEGATIVE

## 2018-10-12 LAB — URINE CYTOLOGY ANCILLARY ONLY
Bacterial vaginitis: POSITIVE — AB
Candida vaginitis: NEGATIVE

## 2018-10-14 ENCOUNTER — Telehealth (HOSPITAL_COMMUNITY): Payer: Self-pay | Admitting: Emergency Medicine

## 2018-10-14 MED ORDER — METRONIDAZOLE 500 MG PO TABS: 500.0000 mg | ORAL_TABLET | Freq: Two times a day (BID) | ORAL | 0 refills | Status: AC

## 2018-10-14 NOTE — Telephone Encounter (Signed)
Bacterial vaginosis is positive. This was not treated at the urgent care visit.  Flagyl 500 mg BID x 7 days #14 no refills sent to patients pharmacy of choice.    Attempted to reach patient. No answer at this time. Full Voicemail.

## 2018-10-18 ENCOUNTER — Telehealth (HOSPITAL_COMMUNITY): Payer: Self-pay | Admitting: Emergency Medicine

## 2018-10-18 NOTE — Telephone Encounter (Signed)
Attempted to reach patient x2. No answer at this time. Mailbox full.   

## 2018-12-21 ENCOUNTER — Encounter (HOSPITAL_COMMUNITY): Payer: Self-pay

## 2018-12-21 ENCOUNTER — Emergency Department (HOSPITAL_COMMUNITY)
Admission: EM | Admit: 2018-12-21 | Discharge: 2018-12-21 | Disposition: A | Discharge status: Home / Self Care | Payer: 59 | Attending: Emergency Medicine | Admitting: Emergency Medicine

## 2018-12-21 DIAGNOSIS — I10 Essential (primary) hypertension: Secondary | ICD-10-CM | POA: Insufficient documentation

## 2018-12-21 DIAGNOSIS — F17200 Nicotine dependence, unspecified, uncomplicated: Secondary | ICD-10-CM | POA: Diagnosis not present

## 2018-12-21 DIAGNOSIS — Z79899 Other long term (current) drug therapy: Secondary | ICD-10-CM | POA: Insufficient documentation

## 2018-12-21 DIAGNOSIS — R6889 Other general symptoms and signs: Secondary | ICD-10-CM | POA: Insufficient documentation

## 2018-12-21 MED ORDER — BENZONATATE 100 MG PO CAPS: 100.0000 mg | ORAL_CAPSULE | Freq: Three times a day (TID) | ORAL | 0 refills | Status: AC

## 2018-12-21 MED ORDER — GUAIFENESIN ER 600 MG PO TB12: 600.0000 mg | ORAL_TABLET | Freq: Two times a day (BID) | ORAL | 0 refills | Status: AC

## 2018-12-21 MED ORDER — OSELTAMIVIR PHOSPHATE 75 MG PO CAPS: 75.0000 mg | ORAL_CAPSULE | Freq: Two times a day (BID) | ORAL | 0 refills | Status: AC

## 2018-12-21 NOTE — ED Provider Notes (Signed)
[facility] EMERGENCY DEPARTMENT Provider Note   CSN: [id number] Arrival date & time: 12/21/18  1134    History   Chief Complaint Chief Complaint  Patient presents with  . Sore Throat  . Generalized Body Aches    HPI Diana Austin is a 38 y.o. female.     HPI   Pt is a 38 y/o female with a h/o ADHD, HTN, dental caries, who presents to the ED today c/o sore throat, cough, and body aches that started yesterday. Cough is productive of phlegm. She also reports rhinorrhea and congestion. Reports sweats and chills as well but no documented fevers.  Has been able to eat and drink at home.   Pt states her best friend is sick with similar symptoms. Reports sick contacts at work as well.   Past Medical History:  Diagnosis Date  . ADHD (attention deficit hyperactivity disorder)   . Dental caries   . Hypertension     There are no active problems to display for this patient.   Past Surgical History:  Procedure Laterality Date  . CESAREAN SECTION       OB History   No obstetric history on file.      Home Medications    Prior to Admission medications   Medication Sig Start Date End Date Taking? Authorizing Provider  benzonatate (TESSALON) 100 MG capsule Take 1 capsule (100 mg total) by mouth every 8 (eight) hours for 5 days. 12/21/18 12/26/18  Demir Titsworth S, PA-C  guaiFENesin (MUCINEX) 600 MG 12 hr tablet Take 1 tablet (600 mg total) by mouth 2 (two) times daily. 12/21/18   Pierre Cumpton S, PA-C  methocarbamol (ROBAXIN) 500 MG tablet Take 1 tablet (500 mg total) by mouth 2 (two) times daily as needed for muscle spasms. 10/03/15   Everlene Farrier, PA-C  metroNIDAZOLE (FLAGYL) 500 MG tablet Take 1 tablet (500 mg total) by mouth 2 (two) times daily. 10/14/18   Eustace Moore, MD  naproxen (NAPROSYN) 250 MG tablet Take 1 tablet (250 mg total) by mouth 2 (two) times daily with a meal. 10/03/15   Everlene Farrier, PA-C  oseltamivir (TAMIFLU) 75 MG capsule  Take 1 capsule (75 mg total) by mouth every 12 (twelve) hours for 5 days. 12/21/18 12/26/18  Jemma Rasp S, PA-C  oxyCODONE-acetaminophen (PERCOCET/ROXICET) 5-325 MG per tablet Take 2 tablets by mouth every 4 (four) hours as needed for severe pain. 02/11/15   Patel-Mills, Lorelle Formosa, PA-C  potassium chloride (K-DUR) 10 MEQ tablet Take 2 tablets (20 mEq total) by mouth 2 (two) times daily. Take (4 tablets) tonight, followed by 2 tablets twice per day for 1 day. 12/09/16 12/11/16  Alvira Monday, MD    Family History No family history on file.  Social History Social History   Tobacco Use  . Smoking status: Current Every Day Smoker  . Smokeless tobacco: Current User  Substance Use Topics  . Alcohol use: Yes  . Drug use: No     Allergies   Patient has no known allergies.   Review of Systems Review of Systems  Constitutional: Positive for chills and diaphoresis.  HENT: Positive for congestion, rhinorrhea and sore throat.   Eyes: Negative for visual disturbance.  Respiratory: Positive for cough. Negative for shortness of breath.   Cardiovascular: Negative for chest pain.  Gastrointestinal: Negative for abdominal pain, constipation, diarrhea, nausea and vomiting.  Genitourinary: Negative for dysuria.  Musculoskeletal: Positive for myalgias.  Skin: Negative for rash.  Neurological: Negative for headaches.  Physical Exam Updated Vital Signs BP (!) 155/109 (BP Location: Right Arm)   Pulse 78   Temp 98.6 F (37 C) (Oral)   Resp 16   LMP 12/14/2018 (Approximate)   SpO2 97%   Physical Exam Vitals signs and nursing note reviewed.  Constitutional:      General: She is not in acute distress.    Appearance: She is well-developed. She is not ill-appearing or toxic-appearing.  HENT:     Head: Normocephalic and atraumatic.     Right Ear: Tympanic membrane normal.     Left Ear: Tympanic membrane normal.     Nose: Congestion present.     Mouth/Throat:     Mouth: Mucous  membranes are moist.     Pharynx: Uvula midline. No oropharyngeal exudate or posterior oropharyngeal erythema.     Tonsils: No tonsillar exudate. Swelling: 0 on the right. 0 on the left.  Eyes:     Conjunctiva/sclera: Conjunctivae normal.  Neck:     Musculoskeletal: Neck supple.  Cardiovascular:     Rate and Rhythm: Normal rate and regular rhythm.     Heart sounds: No murmur.  Pulmonary:     Effort: Pulmonary effort is normal. No respiratory distress.     Breath sounds: Normal breath sounds. No stridor. No wheezing or rhonchi.  Abdominal:     Palpations: Abdomen is soft.     Tenderness: There is no abdominal tenderness.  Skin:    General: Skin is warm and dry.  Neurological:     Mental Status: She is alert.      ED Treatments / Results  Labs (all labs ordered are listed, but only abnormal results are displayed) Labs Reviewed - No data to display  EKG None  Radiology No results found.  Procedures Procedures (including critical care time)  Medications Ordered in ED Medications - No data to display   Initial Impression / Assessment and Plan / ED Course  I have reviewed the triage vital signs and the nursing notes.  Pertinent labs & imaging results that were available during my care of the patient were reviewed by me and considered in my medical decision making (see chart for details).        Final Clinical Impressions(s) / ED Diagnoses   Final diagnoses:  Flu-like symptoms   Patient with symptoms consistent with influenza.  Vitals are stable, low-grade fever. On my recheck of temp it was 99.22F.  No signs of dehydration, tolerating PO's.  Lungs are clear. Due to patient's presentation and physical exam a chest x-ray was not ordered bc likely diagnosis of flu.  Discussed the cost versus benefit of Tamiflu treatment with the patient.  Discussed side effect profile of tamiflu. Patient will be discharged with instructions to orally hydrate, rest, and use  over-the-counter medications such as anti-inflammatories ibuprofen and Aleve for muscle aches and Tylenol for fever.  Patient will also be given a cough suppressant.    ED Discharge Orders         Ordered    oseltamivir (TAMIFLU) 75 MG capsule  Every 12 hours     12/21/18 1233    benzonatate (TESSALON) 100 MG capsule  Every 8 hours     12/21/18 1233    guaiFENesin (MUCINEX) 600 MG 12 hr tablet  2 times daily     12/21/18 [address] PA-C 12/21/18 1233    Loren Racer, MD 12/25/18 1554

## 2018-12-21 NOTE — ED Notes (Signed)
Discharge instructions and prescriptions discussed with Pt. Pt verbalized understanding. Pt stable and ambulatory.   

## 2018-12-21 NOTE — Discharge Instructions (Addendum)
You should follow up with your primary healthcare provider within the next 3-5 days for reevaluation. ° °You will need to return to the emergency department immediately if you experience any of the following symptoms: ° °Difficulty breathing or shortness or breath °Pain or pressure in the chest or abdomen °Sudden dizziness °Confusion °Severe or persistent vomiting °Flu-like symptoms that improve but then return with fever or worse cough ° °You should stay home for at least 24 hours after your fever is gone except to get medical care or other necessities. Your fever should be gone without the need to use a fever-reducing medicine, such as Tylenol or Motrin. Until then, you should stay home from work, school, travel, shopping, social events, and public gatherings. ° °Stay away from others as much as possible to keep from infecting them. If you must leave home, for example to get medical care, wear a facemask if you have one, or cover coughs and sneezes with a tissue. Wash your hands often to keep from spreading flu to others ° °

## 2018-12-21 NOTE — ED Triage Notes (Signed)
Pt presents for evaluation of body aches and sore throat starting today.

## 2019-04-11 ENCOUNTER — Emergency Department (HOSPITAL_COMMUNITY)
Admission: EM | Admit: 2019-04-11 | Discharge: 2019-04-11 | Disposition: A | Discharge status: Home / Self Care | Payer: 59 | Attending: Emergency Medicine | Admitting: Emergency Medicine

## 2019-04-11 ENCOUNTER — Encounter (HOSPITAL_COMMUNITY): Payer: Self-pay

## 2019-04-11 ENCOUNTER — Emergency Department (HOSPITAL_COMMUNITY): Payer: 59

## 2019-04-11 DIAGNOSIS — I10 Essential (primary) hypertension: Secondary | ICD-10-CM | POA: Diagnosis not present

## 2019-04-11 DIAGNOSIS — R05 Cough: Secondary | ICD-10-CM

## 2019-04-11 DIAGNOSIS — R0602 Shortness of breath: Secondary | ICD-10-CM | POA: Diagnosis not present

## 2019-04-11 DIAGNOSIS — F17228 Nicotine dependence, chewing tobacco, with other nicotine-induced disorders: Secondary | ICD-10-CM | POA: Diagnosis not present

## 2019-04-11 DIAGNOSIS — R059 Cough, unspecified: Secondary | ICD-10-CM

## 2019-04-11 DIAGNOSIS — F17200 Nicotine dependence, unspecified, uncomplicated: Secondary | ICD-10-CM | POA: Diagnosis not present

## 2019-04-11 MED ORDER — BENZONATATE 100 MG PO CAPS: 100.0000 mg | ORAL_CAPSULE | Freq: Three times a day (TID) | ORAL | 0 refills | Status: AC

## 2019-04-11 NOTE — ED Provider Notes (Signed)
[place] EMERGENCY DEPARTMENT Provider Note   CSN: [id number] Arrival date & time: 04/11/19  3810    History   Chief Complaint Chief Complaint  Patient presents with  . Cough    HPI Diana Austin is a 38 y.o. female.     Hx of negative COVID screening on 04/06/2019  The history is provided by the patient. No language interpreter was used.  Cough Cough characteristics: dry, sporadically productive (minimal) Sputum characteristics: "normal" Severity:  Moderate Onset quality:  Gradual Duration:  1 week Timing:  Intermittent Progression:  Worsening Chronicity:  New Smoker: yes   Context: not sick contacts   Context comment:  States she usually gets bronchitis "twice a year" Relieved by:  Nothing Worsened by:  Smoking Ineffective treatments:  None tried Associated symptoms: shortness of breath and sinus congestion   Associated symptoms: no chills, no fever and no wheezing   Risk factors: no recent travel     Past Medical History:  Diagnosis Date  . ADHD (attention deficit hyperactivity disorder)   . Dental caries   . Hypertension     There are no active problems to display for this patient.   Past Surgical History:  Procedure Laterality Date  . CESAREAN SECTION       OB History   No obstetric history on file.      Home Medications    Prior to Admission medications   Medication Sig Start Date End Date Taking? Authorizing Provider  guaiFENesin (MUCINEX) 600 MG 12 hr tablet Take 1 tablet (600 mg total) by mouth 2 (two) times daily. 12/21/18   Couture, Cortni S, PA-C  methocarbamol (ROBAXIN) 500 MG tablet Take 1 tablet (500 mg total) by mouth 2 (two) times daily as needed for muscle spasms. 10/03/15   Waynetta Pean, PA-C  metroNIDAZOLE (FLAGYL) 500 MG tablet Take 1 tablet (500 mg total) by mouth 2 (two) times daily. 10/14/18   Raylene Everts, MD  naproxen (NAPROSYN) 250 MG tablet Take 1 tablet (250 mg total) by mouth 2 (two) times  daily with a meal. 10/03/15   Waynetta Pean, PA-C  oxyCODONE-acetaminophen (PERCOCET/ROXICET) 5-325 MG per tablet Take 2 tablets by mouth every 4 (four) hours as needed for severe pain. 02/11/15   Patel-Mills, Orvil Feil, PA-C  potassium chloride (K-DUR) 10 MEQ tablet Take 2 tablets (20 mEq total) by mouth 2 (two) times daily. Take 84meq (4 tablets) tonight, followed by 2 tablets twice per day for 1 day. 12/09/16 12/11/16  Gareth Morgan, MD    Family History No family history on file.  Social History Social History   Tobacco Use  . Smoking status: Current Every Day Smoker  . Smokeless tobacco: Current User  Substance Use Topics  . Alcohol use: Yes  . Drug use: No     Allergies   Patient has no known allergies.   Review of Systems Review of Systems  Constitutional: Negative for chills and fever.  Respiratory: Positive for cough and shortness of breath. Negative for wheezing.   Ten systems reviewed and are negative for acute change, except as noted in the HPI.    Physical Exam Updated Vital Signs BP (!) 148/131   Pulse 73   Temp 99.4 F (37.4 C) (Oral)   Resp 11   SpO2 100%   Physical Exam Vitals signs and nursing note reviewed.  Constitutional:      General: She is not in acute distress.    Appearance: She is well-developed. She is not diaphoretic.  Comments: Nontoxic appearing, pleasant. Patient in NAD.  HENT:     Head: Normocephalic and atraumatic.     Nose: Congestion (audible) present.  Eyes:     General: No scleral icterus.    Conjunctiva/sclera: Conjunctivae normal.  Neck:     Musculoskeletal: Normal range of motion.  Cardiovascular:     Rate and Rhythm: Normal rate and regular rhythm.     Pulses: Normal pulses.  Pulmonary:     Effort: Pulmonary effort is normal. No respiratory distress.     Breath sounds: No stridor. No wheezing or rales.     Comments: Respirations even and unlabored. Lungs CTAB. Sporadic dry, nonproductive cough noted.  Musculoskeletal: Normal range of motion.  Skin:    General: Skin is warm and dry.     Coloration: Skin is not pale.     Findings: No erythema or rash.  Neurological:     General: No focal deficit present.     Mental Status: She is alert and oriented to person, place, and time.     Coordination: Coordination normal.     Comments: GCS 15. Moving all extremities spontaneously.  Psychiatric:        Behavior: Behavior normal.      ED Treatments / Results  Labs (all labs ordered are listed, but only abnormal results are displayed) Labs Reviewed - No data to display  EKG EKG Interpretation  Date/Time:  Monday April 11 2019 06:04:26 EDT Ventricular Rate:  82 PR Interval:    QRS Duration: 69 QT Interval:  392 QTC Calculation: 458 R Axis:   43 Text Interpretation:  Sinus rhythm Consider right atrial enlargement Nonspecific T abnormalities, lateral leads Confirmed by Geoffery LyonseLo, Douglas ([id number]) on 04/11/2019 6:07:22 AM   Radiology No results found.  Procedures Procedures (including critical care time)  Medications Ordered in ED Medications - No data to display   Initial Impression / Assessment and Plan / ED Course  I have reviewed the triage vital signs and the nursing notes.  Pertinent labs & imaging results that were available during my care of the patient were reviewed by me and considered in my medical decision making (see chart for details).        Patient presenting for cough x 1 week. Intermittently productive of minimal phlegm.  Patient afebrile in the emergency department.  No hypoxia.  Her lung sounds are grossly clear.  Pending chest x-ray to evaluate for pneumonia, pneumothorax, pleural effusion.  EKG without acute cardiopulmonary abnormality.  [facility] PA-C at change of shift will follow up on imaging and disposition appropriately.   Final Clinical Impressions(s) / ED Diagnoses   Final diagnoses:  Cough    ED Discharge Orders     None       Antony MaduraHumes, Audie Wieser, PA-C 04/11/19 0700    Geoffery Lyonselo, [place], MD 04/11/19 [phone]

## 2019-04-11 NOTE — Discharge Instructions (Addendum)
We recommend the use of daily Zyrtec or Claritin as well as Coricidin HBP cough medicine.  You have been prescribed Tessalon to use for cough management, as needed.  Follow-up with a primary care doctor.

## 2019-04-11 NOTE — ED Triage Notes (Signed)
Pt sates that she has had a dry cough for the past week, went and got tested for covid for the third time which was negative. Pt states when she starts coughing it causes her to get SOB and some chest tightness

## 2019-04-11 NOTE — ED Provider Notes (Signed)
  Physical Exam  BP (!) 148/131   Pulse 73   Temp 99.4 F (37.4 C) (Oral)   Resp 11   SpO2 100%   Physical Exam   Gen: appears nontoxic Pulm: speaking without difficulty Cv: rrr  ED Course/Procedures     Procedures  MDM   Pt signed out to me by Ermalinda Memos, PA-C. Please see previous notes for further history.   In brief, pt presenting for evaluation of cough. Pending cxr.   CXR viewed and interpreted by me, no pna, pnx, effusion. On my exam, no difficulty breathing, no hypoxia. Pt's BP is elevated, pt with h/o htn and has not taken her bp meds today. I discussed importance of taking bp meds and f/u with pcp for recheck. At this time, pt appears safe for d/c. Return precautions given. Pt states she understands and agrees to plan.        Franchot Heidelberg, PA-C 04/11/19 [place], [place], DO 04/11/19 [phone]

## 2019-06-28 ENCOUNTER — Emergency Department (HOSPITAL_COMMUNITY): Payer: 59

## 2019-06-28 ENCOUNTER — Emergency Department (HOSPITAL_COMMUNITY)
Admission: EM | Admit: 2019-06-28 | Discharge: 2019-06-28 | Disposition: A | Discharge status: Home / Self Care | Payer: 59 | Attending: Emergency Medicine | Admitting: Emergency Medicine

## 2019-06-28 ENCOUNTER — Encounter (HOSPITAL_COMMUNITY): Payer: Self-pay | Admitting: Emergency Medicine

## 2019-06-28 ENCOUNTER — Other Ambulatory Visit: Payer: Self-pay

## 2019-06-28 DIAGNOSIS — I10 Essential (primary) hypertension: Secondary | ICD-10-CM | POA: Insufficient documentation

## 2019-06-28 DIAGNOSIS — R0789 Other chest pain: Secondary | ICD-10-CM | POA: Diagnosis present

## 2019-06-28 DIAGNOSIS — F17200 Nicotine dependence, unspecified, uncomplicated: Secondary | ICD-10-CM | POA: Insufficient documentation

## 2019-06-28 DIAGNOSIS — G5602 Carpal tunnel syndrome, left upper limb: Secondary | ICD-10-CM | POA: Diagnosis not present

## 2019-06-28 DIAGNOSIS — R079 Chest pain, unspecified: Secondary | ICD-10-CM

## 2019-06-28 DIAGNOSIS — Z79899 Other long term (current) drug therapy: Secondary | ICD-10-CM | POA: Insufficient documentation

## 2019-06-28 LAB — I-STAT BETA HCG BLOOD, ED (MC, WL, AP ONLY): I-stat hCG, quantitative: <5 m[IU]/mL (ref <5)

## 2019-06-28 LAB — BASIC METABOLIC PANEL
Anion gap: 12 (ref 5–15)
BUN: 9 mg/dL (ref 6–20)
CO2: 21 mmol/L — ABNORMAL LOW (ref 22–32)
Calcium: 8.8 mg/dL — ABNORMAL LOW (ref 8.9–10.3)
Chloride: 102 mmol/L (ref 98–111)
Creatinine, Ser: 0.87 mg/dL (ref 0.44–1.00)
GFR calc Af Amer: >60 mL/min (ref >60)
GFR calc non Af Amer: >60 mL/min (ref >60)
Glucose, Bld: 88 mg/dL (ref 70–99)
Potassium: 3.5 mmol/L (ref 3.5–5.1)
Sodium: 135 mmol/L (ref 135–145)

## 2019-06-28 LAB — CBC
HCT: 37.5 % (ref 36.0–46.0)
Hemoglobin: 11.6 g/dL — ABNORMAL LOW (ref 12.0–15.0)
MCH: 24 pg — ABNORMAL LOW (ref 26.0–34.0)
MCHC: 30.9 g/dL (ref 30.0–36.0)
MCV: 77.6 fL — ABNORMAL LOW (ref 80.0–100.0)
Platelets: 436 10*3/uL — ABNORMAL HIGH (ref 150–400)
RBC: 4.83 MIL/uL (ref 3.87–5.11)
RDW: 18.4 % — ABNORMAL HIGH (ref 11.5–15.5)
WBC: 4.9 10*3/uL (ref 4.0–10.5)
nRBC: 0 % (ref 0.0–0.2)

## 2019-06-28 LAB — TROPONIN I (HIGH SENSITIVITY)
Troponin I (High Sensitivity): 10 ng/L (ref <18)
Troponin I (High Sensitivity): 8 ng/L (ref <18)

## 2019-06-28 MED ORDER — SODIUM CHLORIDE 0.9% FLUSH: 3.0000 mL | Freq: Once | INTRAVENOUS | Status: DC

## 2019-06-28 NOTE — Discharge Instructions (Addendum)
You were seen in the ED today for chest pain.  Your labwork and chest x ray were very reassuring today.  Please follow up with your PCP in regards to this.  Return to the ED immediately for any worsening pain, shortness of breath, vomiting.   In terms of your arm tingling; this may be related to carpal tunnel. We have given you a brace in the ED but you can pick one up specifically for carpal tunnel OTC at any store including [employer] and [facility]. It is recommended to wear the splint at night to help keep your hand straight but you  may also wear it during the day when you do repetitive movements at your job. You can take Ibuprofen as needed for the pain as well. If no improvement with conservative measures, please follow up with your PCP or sports medicine Dr. Doreatha Martin.

## 2019-06-28 NOTE — ED Provider Notes (Signed)
[facility] EMERGENCY DEPARTMENT Provider Note   CSN: [id number] Arrival date & time: 06/28/19  6861     History   Chief Complaint Chief Complaint  Patient presents with  . Chest Pain    HPI Diana Austin is a 38 y.o. female with PMHx HTN who presents to the ED today planing of sudden onset, intermittent, sharp, left-sided chest pain that began about 1 week ago.  Patient states that the pain typically lasts 5 to 10 minutes and then goes away.  She has about 3-4 episodes per day.  She also complains about a tingling sensation to her left arm when the pain begins.  Denies shortness of breath, nausea, vomiting, diaphoresis, leg swelling.  Patient states that she has had similar pain in the past and was told that it was an anxiety attack.  Patient does not take anything for anxiety currently.  No family history of heart disease.  Is a current every day smoker.  States she smokes about 1/4 pack/day.  No recent prolonged travel or immobilization.  No history of DVT/PE.  No estrogen therapy.  No active malignancy.  No hemoptysis.  Patient does endorse that she recently lost a friend about 1 week ago back to the pain beginning.  She is questioning whether this is a manifestation of her grief.  No SI or HI.  No auditory or visual hallucinations.     Past Medical History:  Diagnosis Date  . ADHD (attention deficit hyperactivity disorder)   . Dental caries   . Hypertension     There are no active problems to display for this patient.   Past Surgical History:  Procedure Laterality Date  . CESAREAN SECTION       OB History   No obstetric history on file.      Home Medications    Prior to Admission medications   Medication Sig Start Date End Date Taking? Authorizing Provider  benzonatate (TESSALON) 100 MG capsule Take 1 capsule (100 mg total) by mouth every 8 (eight) hours. 04/11/19   Caccavale, Sophia, PA-C  guaiFENesin (MUCINEX) 600 MG 12 hr tablet Take 1 tablet (600  mg total) by mouth 2 (two) times daily. 12/21/18   Couture, Cortni S, PA-C  methocarbamol (ROBAXIN) 500 MG tablet Take 1 tablet (500 mg total) by mouth 2 (two) times daily as needed for muscle spasms. 10/03/15   Everlene Farrier, PA-C  metroNIDAZOLE (FLAGYL) 500 MG tablet Take 1 tablet (500 mg total) by mouth 2 (two) times daily. 10/14/18   Eustace Moore, MD  naproxen (NAPROSYN) 250 MG tablet Take 1 tablet (250 mg total) by mouth 2 (two) times daily with a meal. 10/03/15   Everlene Farrier, PA-C  oxyCODONE-acetaminophen (PERCOCET/ROXICET) 5-325 MG per tablet Take 2 tablets by mouth every 4 (four) hours as needed for severe pain. 02/11/15   Patel-Mills, Lorelle Formosa, PA-C  potassium chloride (K-DUR) 10 MEQ tablet Take 2 tablets (20 mEq total) by mouth 2 (two) times daily. Take (4 tablets) tonight, followed by 2 tablets twice per day for 1 day. 12/09/16 12/11/16  Alvira Monday, MD    Family History History reviewed. No pertinent family history.  Social History Social History   Tobacco Use  . Smoking status: Current Every Day Smoker  . Smokeless tobacco: Current User  Substance Use Topics  . Alcohol use: Yes  . Drug use: No     Allergies   Patient has no known allergies.   Review of Systems Review of Systems  Constitutional: Negative for chills and fever.  HENT: Negative for congestion.   Eyes: Negative for visual disturbance.  Respiratory: Negative for cough and shortness of breath.   Cardiovascular: Positive for chest pain. Negative for palpitations and leg swelling.  Gastrointestinal: Negative for abdominal pain, nausea and vomiting.  Genitourinary: Negative for difficulty urinating.  Musculoskeletal: Negative for myalgias.  Skin: Negative for rash.  Neurological: Negative for weakness and numbness.       + paresthesias     Physical Exam Updated Vital Signs BP (!) 147/112 (BP Location: Right Arm)   Pulse 82   Temp 98.4 F (36.9 C) (Oral)   Resp 16   Ht 5\' 6"  (1.676 m)    Wt 98 kg   LMP 06/25/2019 (Exact Date)   SpO2 100%   BMI 34.86 kg/m   Physical Exam Vitals signs and nursing note reviewed.  Constitutional:      Appearance: She is not ill-appearing.  HENT:     Head: Normocephalic and atraumatic.  Eyes:     Conjunctiva/sclera: Conjunctivae normal.  Neck:     Musculoskeletal: Neck supple.  Cardiovascular:     Rate and Rhythm: Normal rate and regular rhythm.     Pulses:          Radial pulses are 2+ on the right side and 2+ on the left side.       Dorsalis pedis pulses are 2+ on the right side and 2+ on the left side.     Heart sounds: Normal heart sounds.  Pulmonary:     Effort: Pulmonary effort is normal.     Breath sounds: Normal breath sounds. No decreased breath sounds, wheezing, rhonchi or rales.  Chest:     Chest wall: Tenderness present.     Comments: No overlying skin changes to the chest.  Patient does have tenderness to palpation along sternum.  Abdominal:     Palpations: Abdomen is soft.     Tenderness: There is no abdominal tenderness. There is no guarding or rebound.  Musculoskeletal:     Right lower leg: No edema.     Left lower leg: No edema.     Comments: No swelling or deformity noted to left shoulder.  No tenderness to shoulder, elbow, wrist.  Patient has full range of motion of the left arm.  Strength 5 out of 5.  Sensation intact throughout.  Negative Tinel sign.  Patient does have positive Phalen's test.  2+ distal pulses.   Skin:    General: Skin is warm and dry.  Neurological:     Mental Status: She is alert.      ED Treatments / Results  Labs (all labs ordered are listed, but only abnormal results are displayed) Labs Reviewed  BASIC METABOLIC PANEL - Abnormal; Notable for the following components:      Result Value   CO2 21 (*)    Calcium 8.8 (*)    All other components within normal limits  CBC - Abnormal; Notable for the following components:   Hemoglobin 11.6 (*)    MCV 77.6 (*)    MCH 24.0 (*)     RDW 18.4 (*)    Platelets 436 (*)    All other components within normal limits  I-STAT BETA HCG BLOOD, ED ([facility], [facility], [facility] ONLY)  TROPONIN I (HIGH SENSITIVITY)  TROPONIN I (HIGH SENSITIVITY)    EKG EKG Interpretation  Date/Time:  Tuesday June 28 2019 05:45:29 EDT Ventricular Rate:  73 PR Interval:  138 QRS  Duration: 74 QT Interval:  422 QTC Calculation: 464 R Axis:   56 Text Interpretation:  Normal sinus rhythm Cannot rule out Anterior infarct , age undetermined Abnormal ECG When compred to prior, no significant changes seen.  No STEMI Confirmed by Theda Belfastegeler, Chris ([id number]) on 06/28/2019 8:47:06 AM   Radiology Dg Chest 2 View  Result Date: 06/28/2019 CLINICAL DATA:  Chest pain EXAM: CHEST - 2 VIEW COMPARISON:  Radiograph April 11, 2019 FINDINGS: Accounting for body habitus, the lungs are clear. No consolidation, features of edema, pneumothorax, or effusion. Pulmonary vascularity is normally distributed. The cardiomediastinal contours are unremarkable. No acute osseous or soft tissue abnormality. IMPRESSION: No acute cardiopulmonary abnormality. Electronically Signed   By: Kreg ShropshirePrice  DeHay M.D.   On: 06/28/2019 06:36    Procedures Procedures (including critical care time)  Medications Ordered in ED Medications  sodium chloride flush (NS) 0.9 % injection 3 mL (has no administration in time range)     Initial Impression / Assessment and Plan / ED Course  I have reviewed the triage vital signs and the nursing notes.  Pertinent labs & imaging results that were available during my care of the patient were reviewed by me and considered in my medical decision making (see chart for details).    38 year old female who presents to the ED complaining of intermittent episodes of left-sided chest pain as well as left arm paresthesias that began about 1 week ago.  Has high blood pressure and is a current everyday smoker.  No family history of cardiac disease.  Currently does not have any chest pain.   States most recent episode was about 4:30 this morning.  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  She has positive Phalen's test today.  Will give brace upon discharge.  Labs ordered prior to being seen by provider.  EKG without ischemic changes.  No leukocytosis today.  Hemoglobin stable.  No electrolyte abnormalities.  Initial troponin of 10.  Will repeat today.  Patient is PERC negative.   Repeat troponin of 8.  Patient with heart score 3.  Feel she can be followed outpatient for this.  Advised to call PCP today to schedule appointment.  In terms of carpal tunnel, patient advised to wear splint at nighttime/ when she uses her hands.  Ice therapy discussed.  Strict return precautions discussed.  Patient is in agreement with plan at this time stable for discharge home.   HEAR Score: 3    Final Clinical Impressions(s) / ED Diagnoses   Final diagnoses:  Nonspecific chest pain  Carpal tunnel syndrome of left wrist    ED Discharge Orders    None       Tanda RockersVenter, Sahalie Beth, PA-C 06/28/19 [id number]    Tegeler, Canary Brimhristopher J, MD 06/28/19 1735

## 2019-06-28 NOTE — ED Notes (Signed)
Patient verbalizes understanding of discharge instructions. Opportunity for questioning and answers were provided. Armband removed by staff, pt discharged from ED home via POV.  

## 2019-06-28 NOTE — ED Triage Notes (Signed)
Patient with 2 day history of chest pain, which comes and goes in the last two days.  She states that this has never happened to her before.  Patient states that the pain is like sticking a needle into her heart, then calms down and then does it again about one hour to one and a half hours later.  No shortness of breath, nausea or vomiting.

## 2019-09-06 IMAGING — CR DG CHEST 2V
2 series · 2 of 2 positions shown · non-contrast
Comparison: Radiograph April 11, 2019

CLINICAL DATA: Chest pain

EXAM:
CHEST - 2 VIEW

[chest pa]
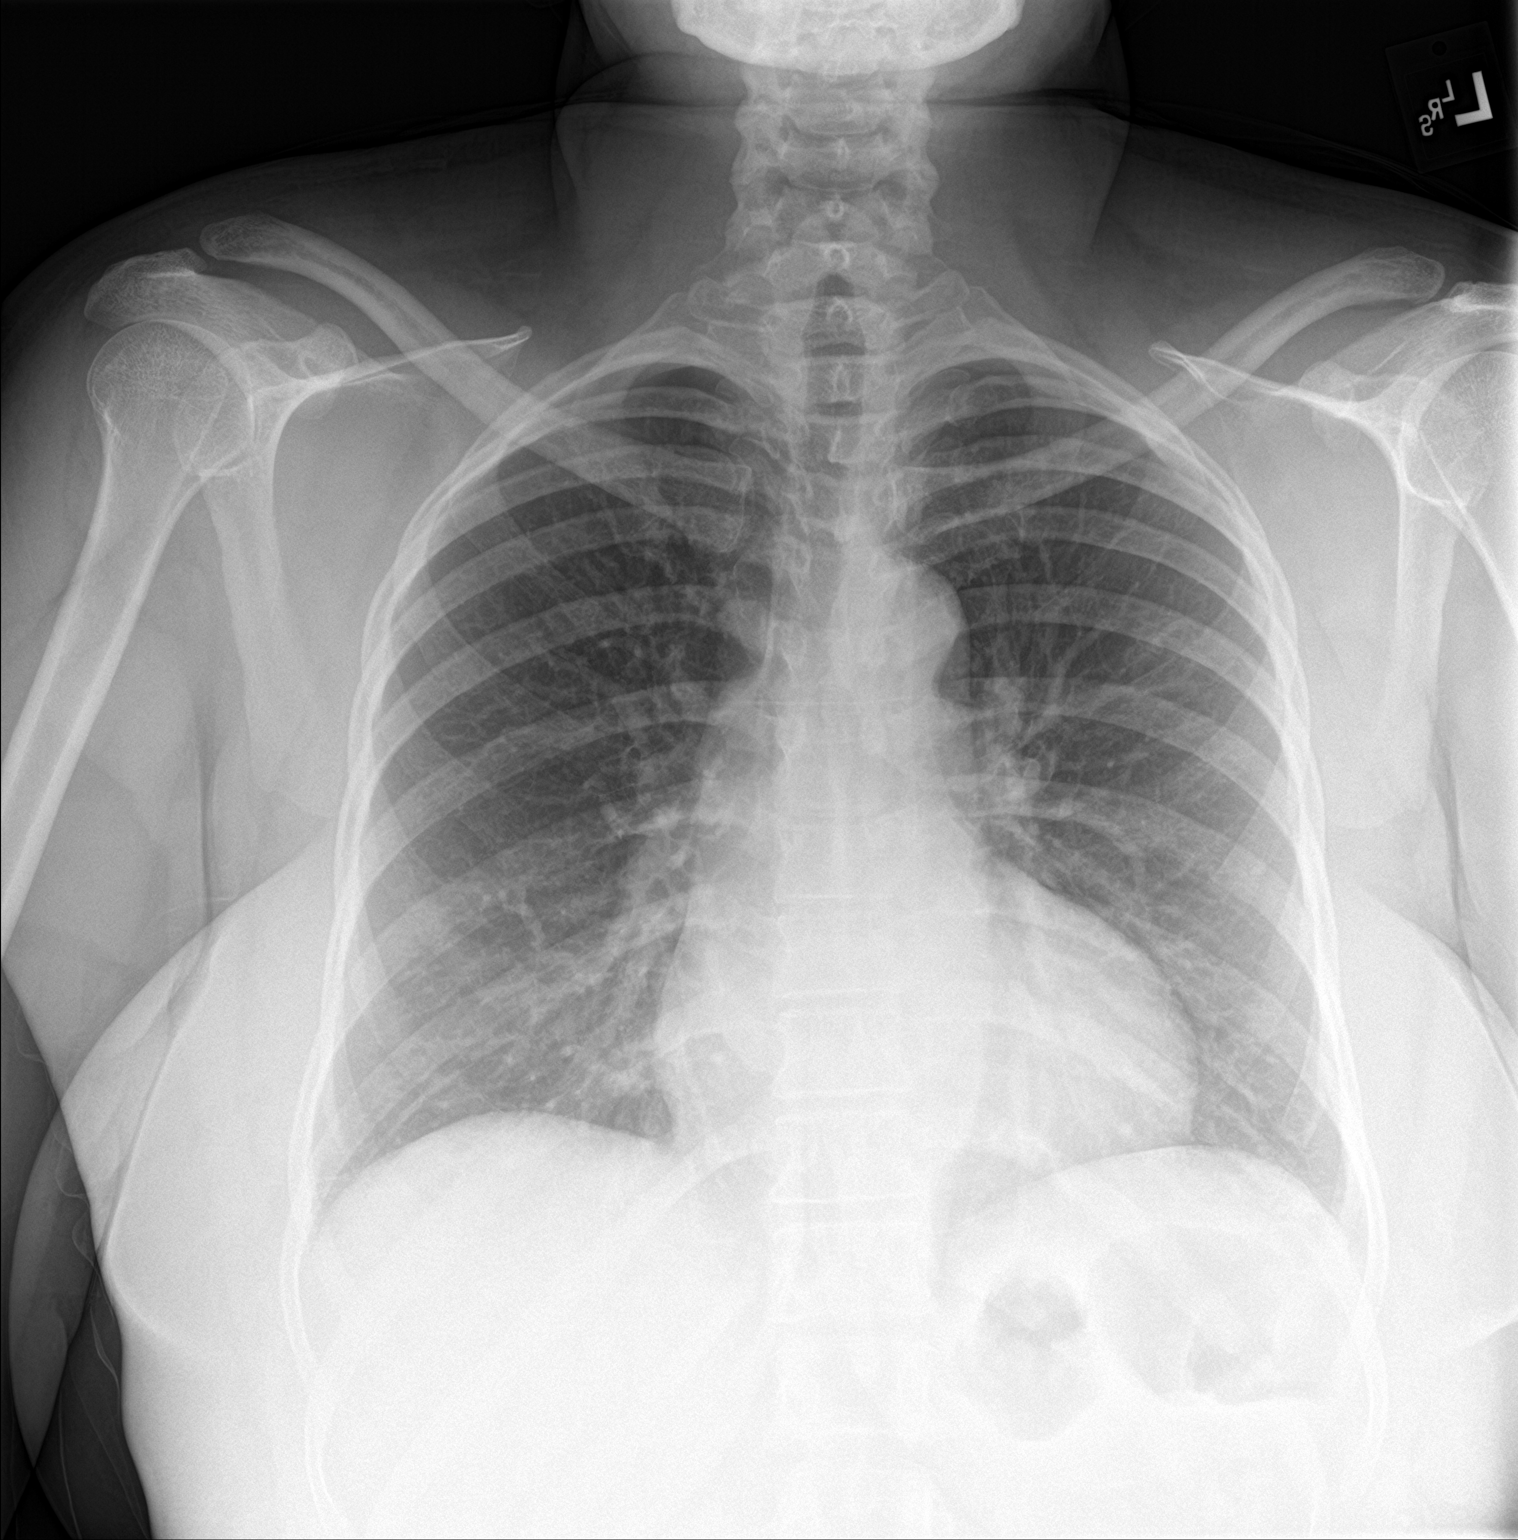

[chest lat]
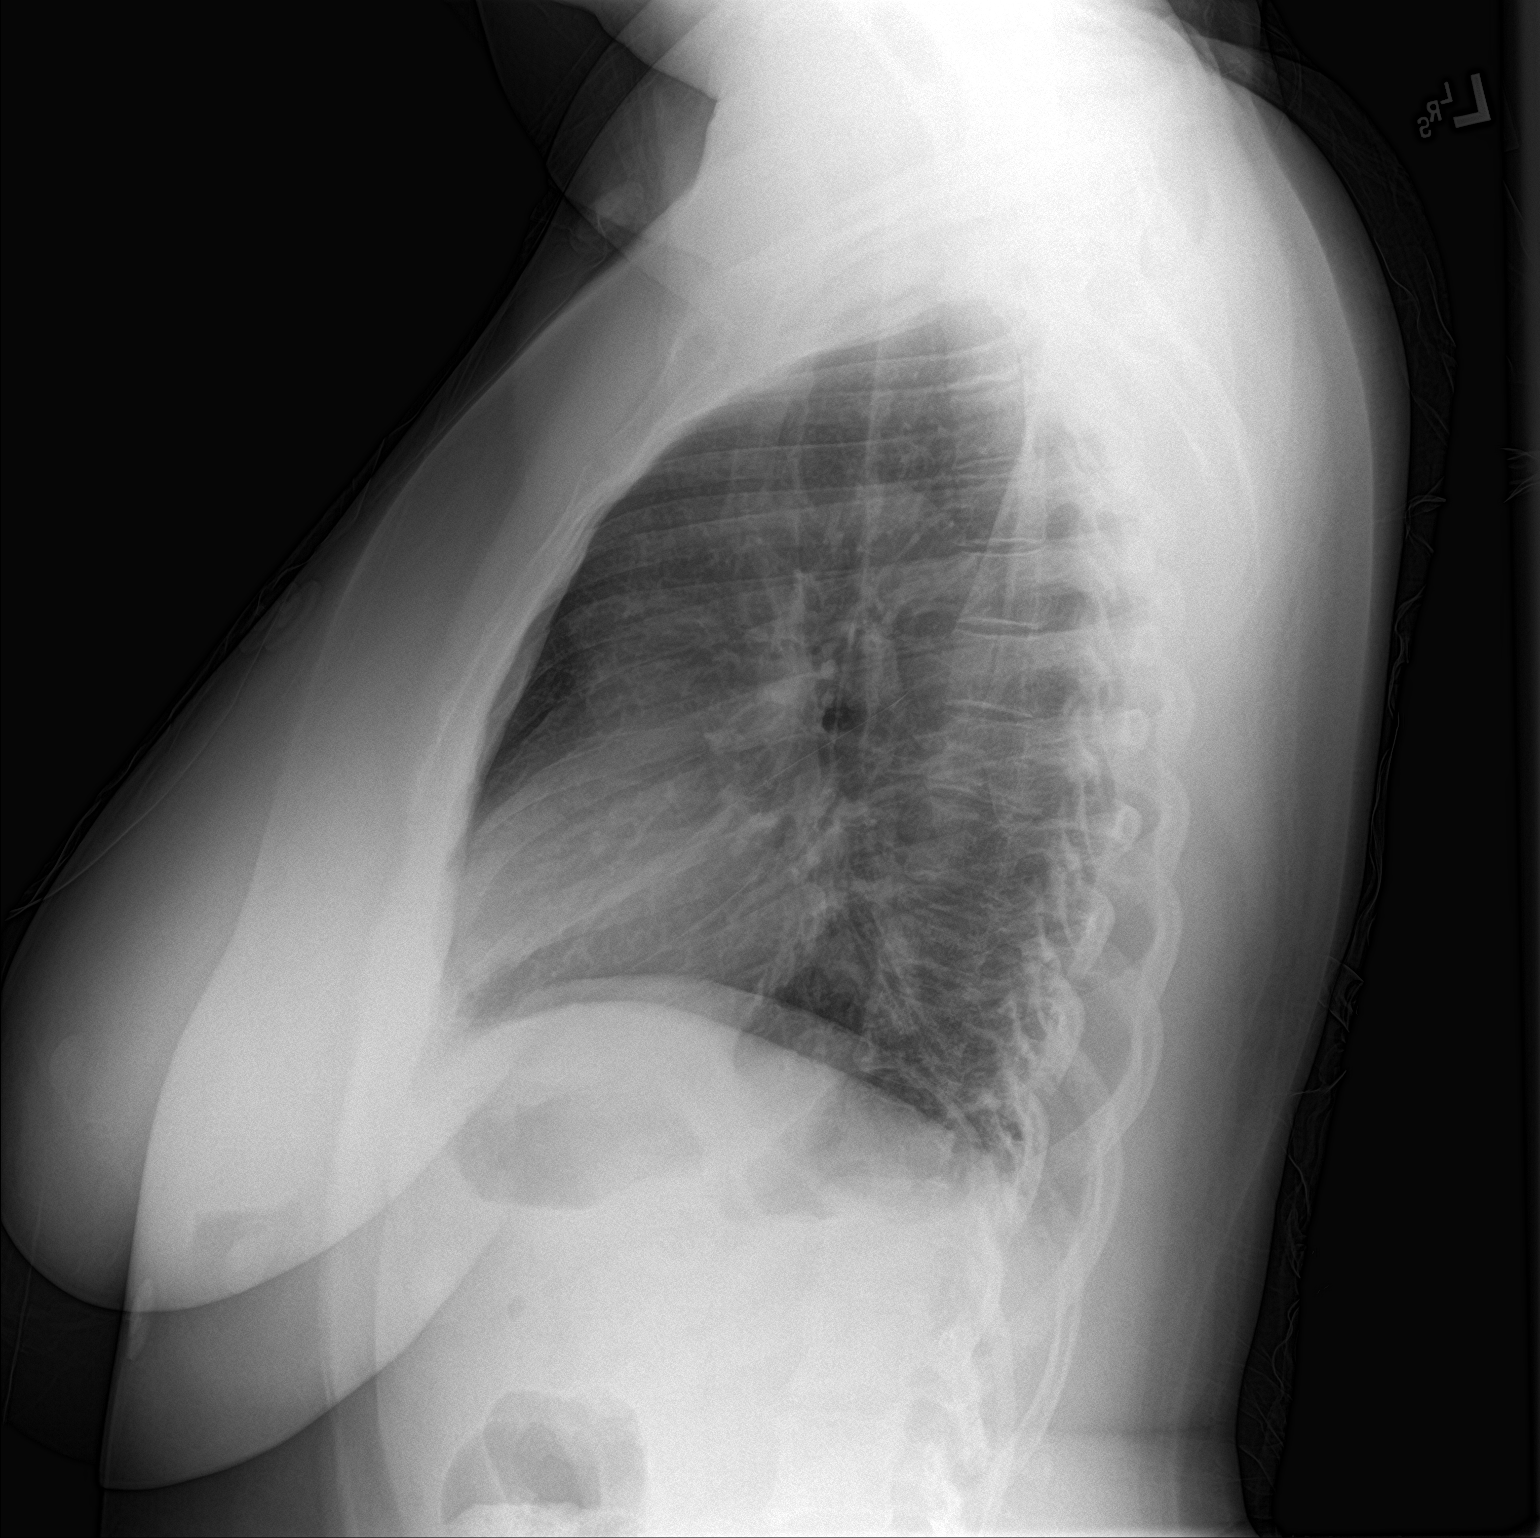

[2 of 2 positions shown; findings below may reference images not displayed]

FINDINGS: Accounting for body habitus, the lungs are clear. No consolidation,
features of edema, pneumothorax, or effusion. Pulmonary vascularity
is normally distributed. The cardiomediastinal contours are
unremarkable. No acute osseous or soft tissue abnormality.
IMPRESSION: No acute cardiopulmonary abnormality.

## 2019-11-16 ENCOUNTER — Emergency Department (HOSPITAL_COMMUNITY): Payer: 59

## 2019-11-16 ENCOUNTER — Encounter (HOSPITAL_COMMUNITY): Payer: Self-pay | Admitting: Emergency Medicine

## 2019-11-16 ENCOUNTER — Emergency Department (HOSPITAL_COMMUNITY)
Admission: EM | Admit: 2019-11-16 | Discharge: 2019-11-16 | Disposition: A | Discharge status: Home / Self Care | Payer: 59 | Attending: Emergency Medicine | Admitting: Emergency Medicine

## 2019-11-16 ENCOUNTER — Other Ambulatory Visit: Payer: Self-pay

## 2019-11-16 DIAGNOSIS — R0602 Shortness of breath: Secondary | ICD-10-CM | POA: Diagnosis not present

## 2019-11-16 DIAGNOSIS — F17200 Nicotine dependence, unspecified, uncomplicated: Secondary | ICD-10-CM | POA: Diagnosis not present

## 2019-11-16 DIAGNOSIS — R0789 Other chest pain: Secondary | ICD-10-CM | POA: Diagnosis not present

## 2019-11-16 DIAGNOSIS — F17228 Nicotine dependence, chewing tobacco, with other nicotine-induced disorders: Secondary | ICD-10-CM | POA: Diagnosis not present

## 2019-11-16 DIAGNOSIS — Z79899 Other long term (current) drug therapy: Secondary | ICD-10-CM | POA: Diagnosis not present

## 2019-11-16 DIAGNOSIS — R079 Chest pain, unspecified: Secondary | ICD-10-CM | POA: Diagnosis present

## 2019-11-16 LAB — BASIC METABOLIC PANEL
Anion gap: 12 (ref 5–15)
BUN: 10 mg/dL (ref 6–20)
CO2: 22 mmol/L (ref 22–32)
Calcium: 9.1 mg/dL (ref 8.9–10.3)
Chloride: 102 mmol/L (ref 98–111)
Creatinine, Ser: 0.93 mg/dL (ref 0.44–1.00)
GFR calc Af Amer: >60 mL/min (ref >60)
GFR calc non Af Amer: >60 mL/min (ref >60)
Glucose, Bld: 85 mg/dL (ref 70–99)
Potassium: 3.6 mmol/L (ref 3.5–5.1)
Sodium: 136 mmol/L (ref 135–145)

## 2019-11-16 LAB — TROPONIN I (HIGH SENSITIVITY)
Troponin I (High Sensitivity): 9 ng/L (ref <18)
Troponin I (High Sensitivity): 10 ng/L (ref <18)

## 2019-11-16 LAB — CBC
HCT: 37.7 % (ref 36.0–46.0)
Hemoglobin: 11.4 g/dL — ABNORMAL LOW (ref 12.0–15.0)
MCH: 24.1 pg — ABNORMAL LOW (ref 26.0–34.0)
MCHC: 30.2 g/dL (ref 30.0–36.0)
MCV: 79.5 fL — ABNORMAL LOW (ref 80.0–100.0)
Platelets: 434 10*3/uL — ABNORMAL HIGH (ref 150–400)
RBC: 4.74 MIL/uL (ref 3.87–5.11)
RDW: 16.8 % — ABNORMAL HIGH (ref 11.5–15.5)
WBC: 5.2 10*3/uL (ref 4.0–10.5)
nRBC: 0 % (ref 0.0–0.2)

## 2019-11-16 LAB — I-STAT BETA HCG BLOOD, ED (MC, WL, AP ONLY): I-stat hCG, quantitative: <5 m[IU]/mL (ref <5)

## 2019-11-16 LAB — D-DIMER, QUANTITATIVE: D-Dimer, Quant: 1.21 ug/mL-FEU — ABNORMAL HIGH (ref 0.00–0.50)

## 2019-11-16 MED ORDER — SODIUM CHLORIDE 0.9% FLUSH
3.0000 mL | Freq: Once | INTRAVENOUS | Status: AC
  Administered 2019-11-16: 3 mL via INTRAVENOUS

## 2019-11-16 MED ORDER — IOHEXOL 350 MG/ML SOLN
80.0000 mL | Freq: Once | INTRAVENOUS | Status: AC | PRN
  Administered 2019-11-16: 80 mL via INTRAVENOUS

## 2019-11-16 NOTE — ED Triage Notes (Signed)
C/o constant stabbing pain to R chest and under L breast x 1 month.  Also reports mild SOB.  Took Gas-X without relief.

## 2019-11-16 NOTE — ED Provider Notes (Signed)
[facility] EMERGENCY DEPARTMENT Provider Note   CSN: [id number] Arrival date & time: 11/16/19  8563     History Chief Complaint  Patient presents with  . Chest Pain    Diana Austin is a 39 y.o. female.  39 year old female with past medical history below including hypertension who presents with chest pain.  Patient reports a 1 month history of constant, sharp chest pain that is under her left breast and on her right anterior chest.  No exacerbating factors.  She states it sometimes feels better after she has slept or when she is laying flat.  She reports some occasional shortness of breath with it but no severe breathing problems.  She had a cough/cold last week but this has completely resolved and her chest pain began prior to that episode.  She denies any associated leg swelling or pain.  No nausea, vomiting, fevers, recent travel, history of blood clots, estrogen use, or history of cancer.  No family history of heart disease.  She smokes cigarettes, occasional alcohol use.  She occasionally uses "other substances" but not consistently.  The history is provided by the patient.  Chest Pain      Past Medical History:  Diagnosis Date  . ADHD (attention deficit hyperactivity disorder)   . Dental caries   . Hypertension     There are no problems to display for this patient.   Past Surgical History:  Procedure Laterality Date  . CESAREAN SECTION       OB History   No obstetric history on file.     No family history on file.  Social History   Tobacco Use  . Smoking status: Current Every Day Smoker  . Smokeless tobacco: Current User  Substance Use Topics  . Alcohol use: Yes  . Drug use: No    Home Medications Prior to Admission medications   Medication Sig Start Date End Date Taking? Authorizing Provider  benzonatate (TESSALON) 100 MG capsule Take 1 capsule (100 mg total) by mouth every 8 (eight) hours. 04/11/19   Caccavale, Sophia, PA-C    guaiFENesin (MUCINEX) 600 MG 12 hr tablet Take 1 tablet (600 mg total) by mouth 2 (two) times daily. 12/21/18   Couture, Cortni S, PA-C  methocarbamol (ROBAXIN) 500 MG tablet Take 1 tablet (500 mg total) by mouth 2 (two) times daily as needed for muscle spasms. 10/03/15   Waynetta Pean, PA-C  metroNIDAZOLE (FLAGYL) 500 MG tablet Take 1 tablet (500 mg total) by mouth 2 (two) times daily. 10/14/18   Raylene Everts, MD  naproxen (NAPROSYN) 250 MG tablet Take 1 tablet (250 mg total) by mouth 2 (two) times daily with a meal. 10/03/15   Waynetta Pean, PA-C  oxyCODONE-acetaminophen (PERCOCET/ROXICET) 5-325 MG per tablet Take 2 tablets by mouth every 4 (four) hours as needed for severe pain. 02/11/15   Patel-Mills, Orvil Feil, PA-C  potassium chloride (K-DUR) 10 MEQ tablet Take 2 tablets (20 mEq total) by mouth 2 (two) times daily. Take 64meq (4 tablets) tonight, followed by 2 tablets twice per day for 1 day. 12/09/16 12/11/16  Gareth Morgan, MD    Allergies    Patient has no known allergies.  Review of Systems   Review of Systems  Cardiovascular: Positive for chest pain.   All other systems reviewed and are negative except that which was mentioned in HPI  Physical Exam Updated Vital Signs BP (!) 143/107   Pulse 64   Temp 98.1 F (36.7 C) (Oral)   Resp  19   LMP 11/11/2019   SpO2 100%   Physical Exam Vitals and nursing note reviewed.  Constitutional:      General: She is not in acute distress.    Appearance: She is well-developed.  HENT:     Head: Normocephalic and atraumatic.  Eyes:     Conjunctiva/sclera: Conjunctivae normal.  Cardiovascular:     Rate and Rhythm: Normal rate and regular rhythm.     Heart sounds: Normal heart sounds. No murmur.  Pulmonary:     Effort: Pulmonary effort is normal.     Breath sounds: Normal breath sounds.  Chest:     Chest wall: No mass, tenderness or crepitus.  Abdominal:     General: Bowel sounds are normal. There is no distension.      Palpations: Abdomen is soft.     Tenderness: There is no abdominal tenderness.  Musculoskeletal:     Cervical back: Neck supple.  Skin:    General: Skin is warm and dry.  Neurological:     Mental Status: She is alert and oriented to person, place, and time.     Comments: Fluent speech  Psychiatric:        Judgment: Judgment normal.     ED Results / Procedures / Treatments   Labs (all labs ordered are listed, but only abnormal results are displayed) Labs Reviewed  CBC - Abnormal; Notable for the following components:      Result Value   Hemoglobin 11.4 (*)    MCV 79.5 (*)    MCH 24.1 (*)    RDW 16.8 (*)    Platelets 434 (*)    All other components within normal limits  D-DIMER, QUANTITATIVE (NOT AT [facility]) - Abnormal; Notable for the following components:   D-Dimer, Quant 1.21 (*)    All other components within normal limits  BASIC METABOLIC PANEL  I-STAT BETA HCG BLOOD, ED ([facility], [facility], [facility] ONLY)  TROPONIN I (HIGH SENSITIVITY)  TROPONIN I (HIGH SENSITIVITY)    EKG EKG Interpretation  Date/Time:  Wednesday November 16 2019 07:35:21 EST Ventricular Rate:  69 PR Interval:  130 QRS Duration: 64 QT Interval:  428 QTC Calculation: 458 R Axis:   57 Text Interpretation: Normal sinus rhythm Nonspecific T wave abnormality Abnormal ECG No significant change since last tracing Confirmed by Frederick Peers [phone]) on 11/16/2019 8:30:54 AM   Radiology DG Chest 2 View  Result Date: 11/16/2019 CLINICAL DATA:  Chest pain EXAM: CHEST - 2 VIEW COMPARISON:  June 28, 2019 FINDINGS: Lungs are clear. Heart size and pulmonary vascularity are normal. No adenopathy. No pneumothorax. No bone lesions. IMPRESSION: Lungs clear.  Stable cardiac silhouette. Electronically Signed   By: Bretta Bang III M.D.   On: 11/16/2019 08:18   CT Angio Chest PE W/Cm &/Or Wo Cm  Result Date: 11/16/2019 CLINICAL DATA:  Chest pain and shortness of breath. Elevated D-dimer EXAM: CT ANGIOGRAPHY CHEST WITH  CONTRAST TECHNIQUE: Multidetector CT imaging of the chest was performed using the standard protocol during bolus administration of intravenous contrast. Multiplanar CT image reconstructions and MIPs were obtained to evaluate the vascular anatomy. CONTRAST:  36mL OMNIPAQUE IOHEXOL 350 MG/ML SOLN COMPARISON:  Chest radiograph November 16, 2019 FINDINGS: Cardiovascular: There is no demonstrable pulmonary embolus. There is no thoracic aortic aneurysm or dissection. The visualized great vessels appear unremarkable. There is no evident pericardial effusion or pericardial thickening. Mediastinum/Nodes: No focal thyroid lesions evident. No evident thoracic adenopathy. There is a small hiatal hernia. Lungs/Pleura: There is mild  bibasilar atelectasis. Lungs otherwise are clear. No pleural effusions are evident. Upper Abdomen: Visualized upper abdominal structures appear normal. Musculoskeletal: There are no blastic or lytic bone lesions. No evident chest wall lesion. Review of the MIP images confirms the above findings. IMPRESSION: 1. No demonstrable pulmonary embolus. No thoracic aortic aneurysm or dissection. 2.  Mild bibasilar atelectasis.  Lungs elsewhere clear. 3.  No evident adenopathy. 4.  Small hiatal hernia. Electronically Signed   By: Bretta Bang III M.D.   On: 11/16/2019 12:22    Procedures Procedures (including critical care time)  Medications Ordered in ED Medications  sodium chloride flush (NS) 0.9 % injection 3 mL (3 mLs Intravenous Given 11/16/19 0842)  iohexol (OMNIPAQUE) 350 MG/ML injection 80 mL (80 mLs Intravenous Contrast Given 11/16/19 1216)    ED Course  I have reviewed the triage vital signs and the nursing notes.  Pertinent labs & imaging results that were available during my care of the patient were reviewed by me and considered in my medical decision making (see chart for details).    MDM Rules/Calculators/A&P                       Comfortable on exam, initially hypertensive  at 179/117 but remainder of vital signs normal.  EKG unchanged from previous.  Patient states that she is compliant with home medication amlodipine 10 mg.  She has already been contacting her PCP for follow-up and I discussed importance of discussing blood pressure medication.  Chest x-ray clear, serial trops negative. D-dimer elevated however CTA negative for PE or other acute process.  Given 1 month of constant and atypical symptoms, symptoms are not suggestive of ACS. HEART score <3. Discussed PCP f/u and return precautions.   Final Clinical Impression(s) / ED Diagnoses Final diagnoses:  Atypical chest pain    Rx / DC Orders ED Discharge Orders    None       Keegan Ducey, Ambrose Finland, MD 11/16/19 1235

## 2020-01-25 IMAGING — DX DG CHEST 2V
2 series · 2 of 2 positions shown · non-contrast
Comparison: June 28, 2019

CLINICAL DATA: Chest pain

EXAM:
CHEST - 2 VIEW

[chest pa]
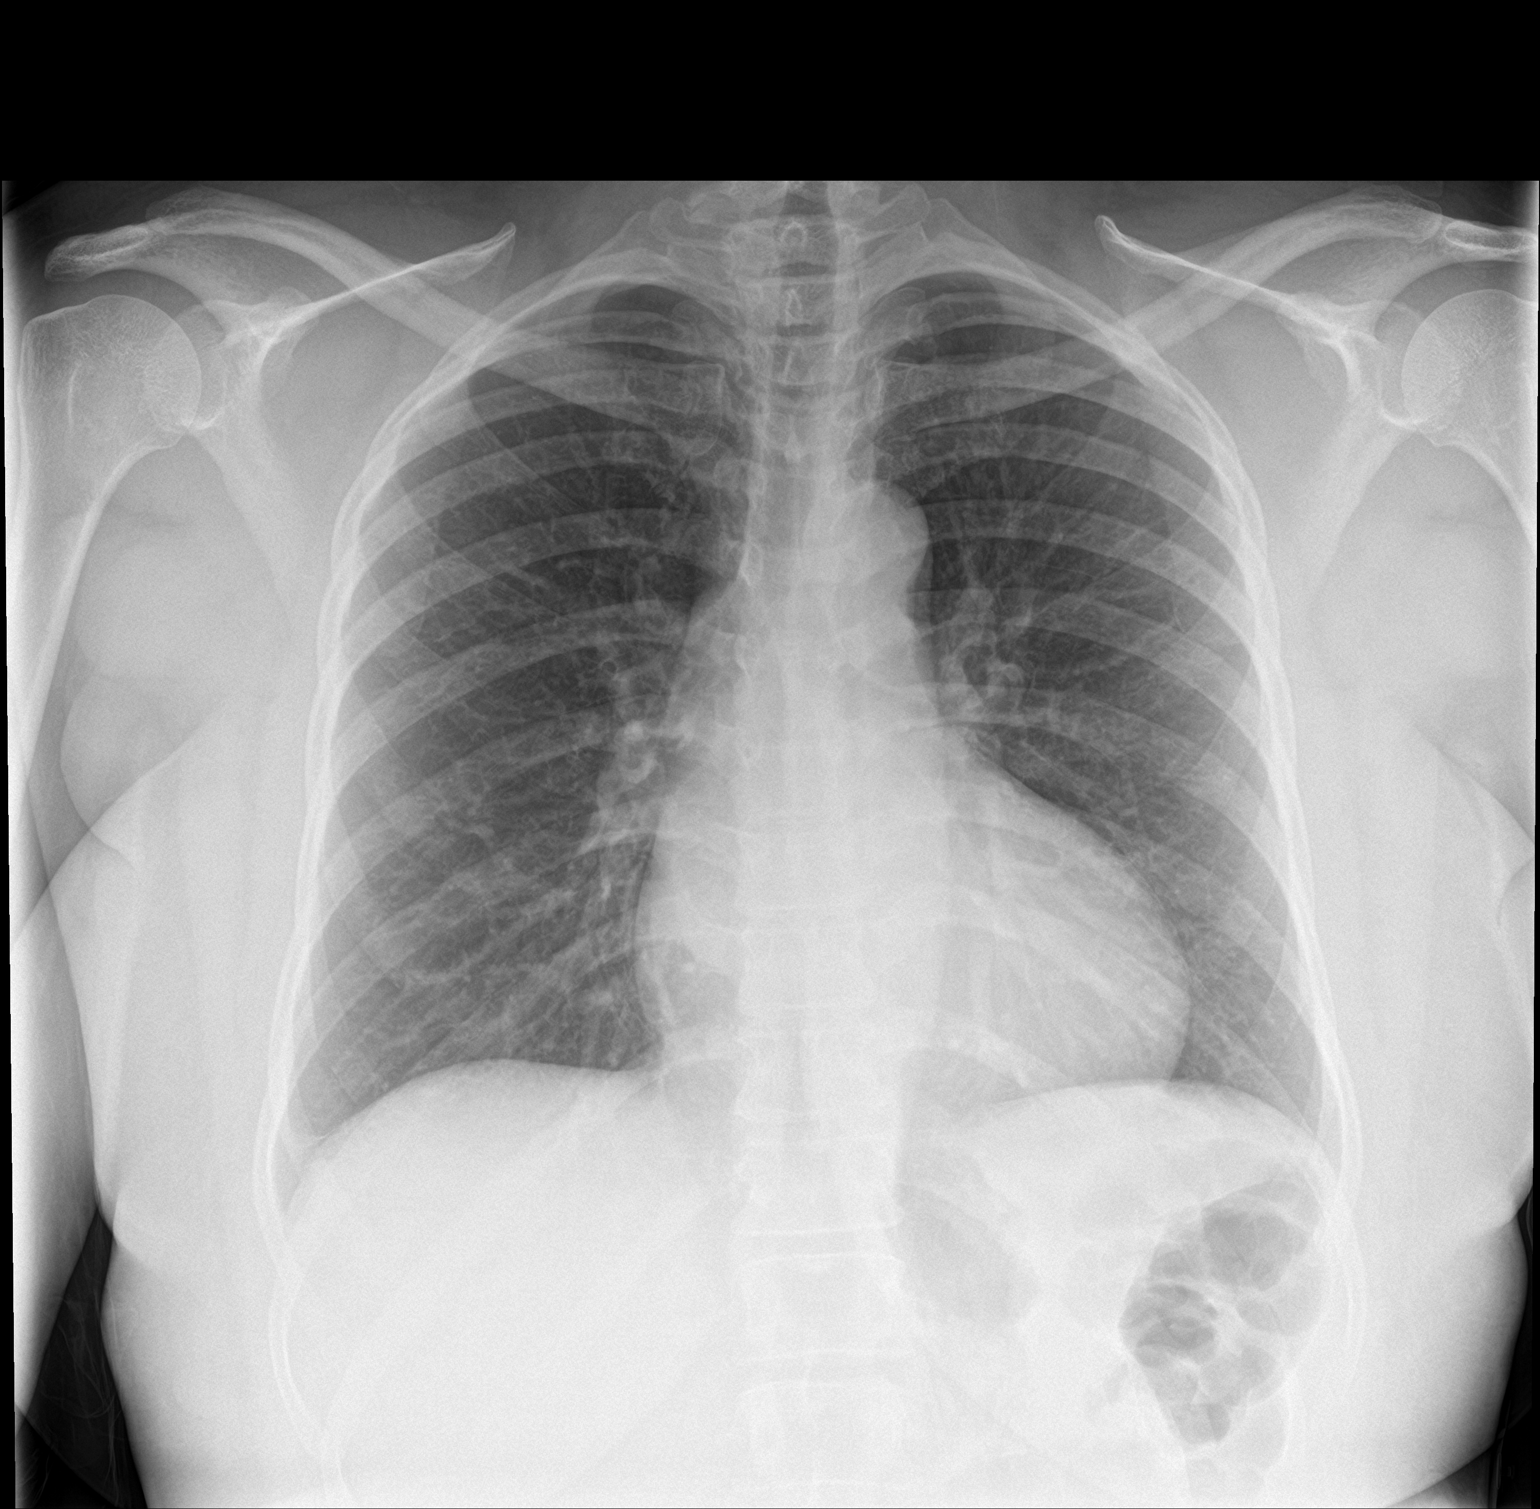

[chest lat]
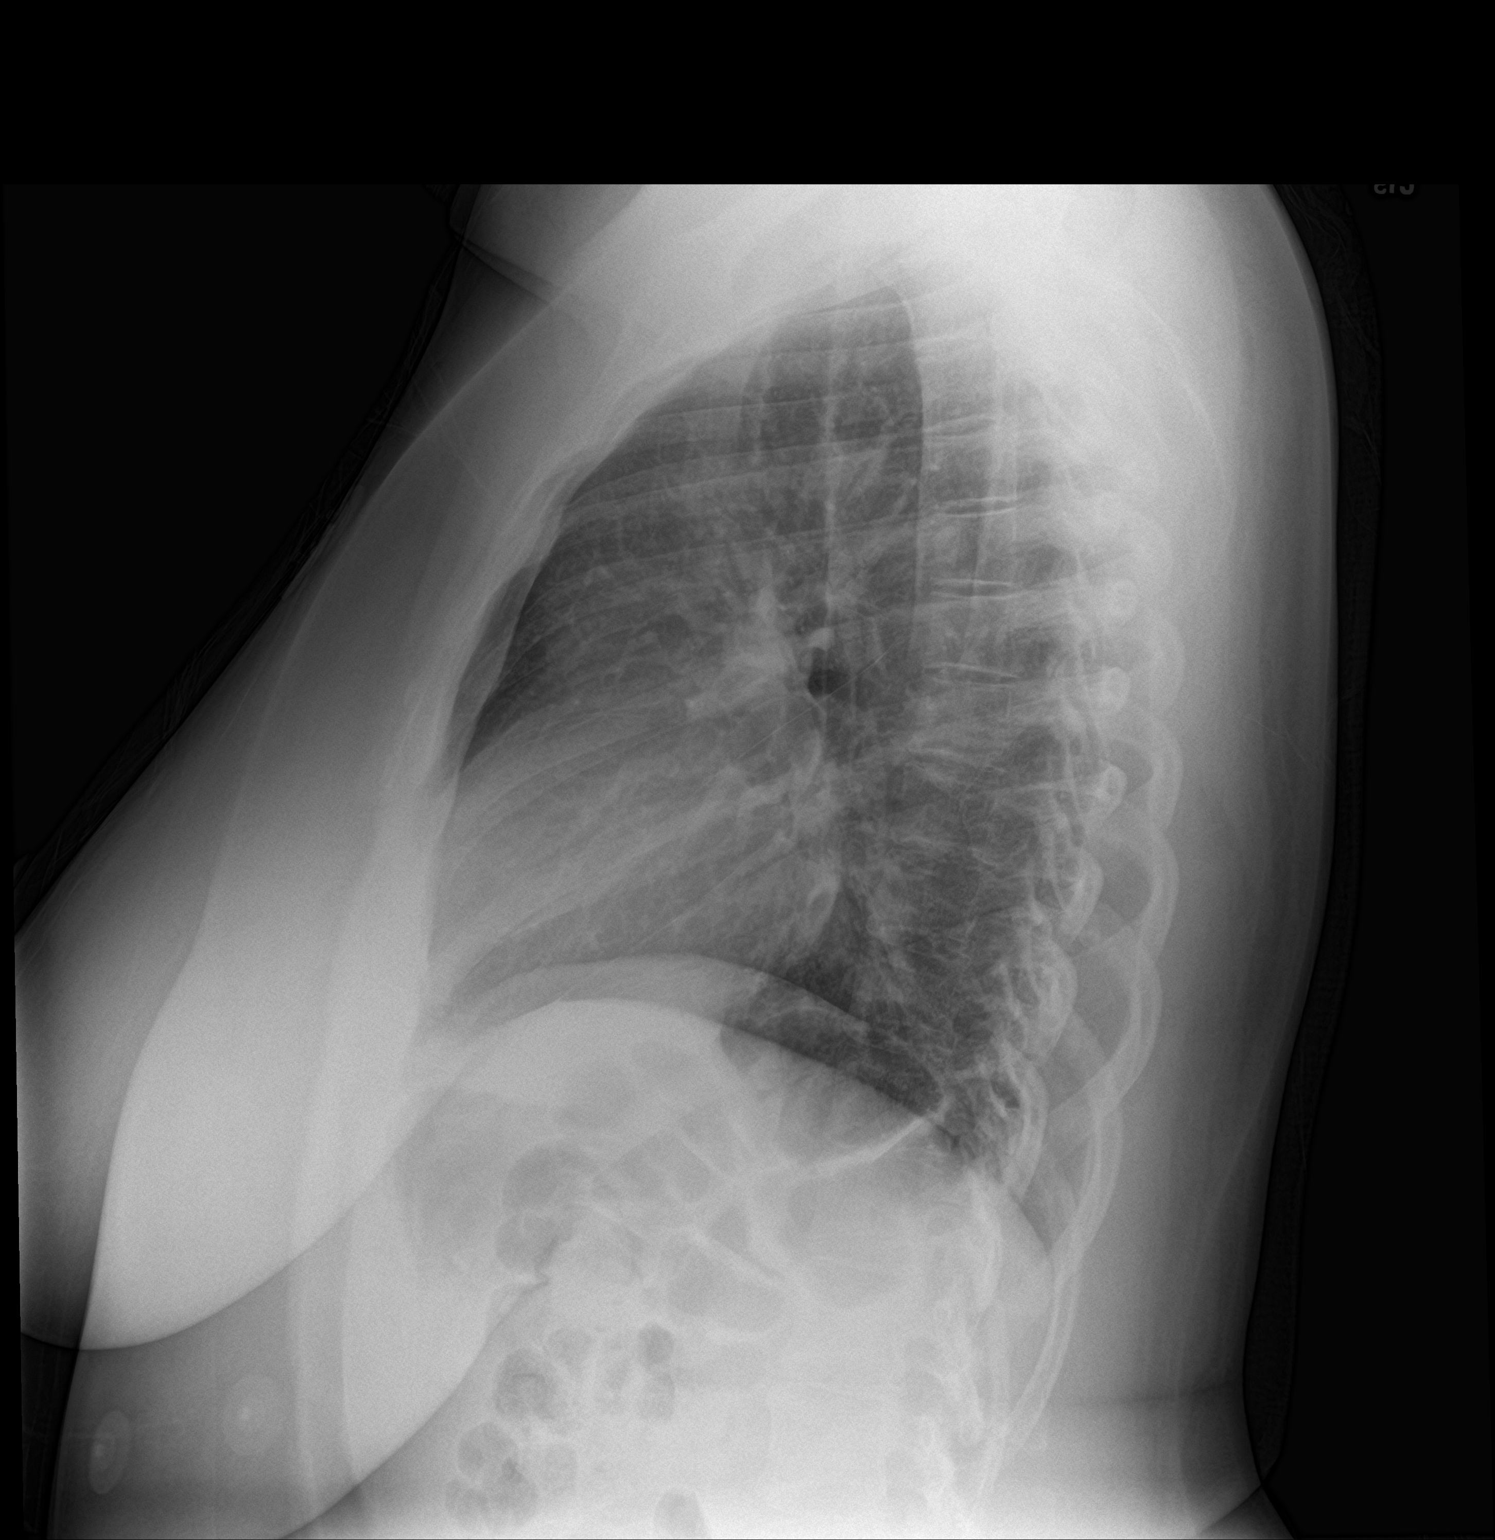

[2 of 2 positions shown; findings below may reference images not displayed]

FINDINGS: Lungs are clear. Heart size and pulmonary vascularity are normal. No
adenopathy. No pneumothorax. No bone lesions.
IMPRESSION: Lungs clear.  Stable cardiac silhouette.

## 2020-07-10 ENCOUNTER — Other Ambulatory Visit: Payer: Self-pay

## 2020-07-10 ENCOUNTER — Other Ambulatory Visit: Payer: 59

## 2020-07-10 DIAGNOSIS — Z20822 Contact with and (suspected) exposure to covid-19: Secondary | ICD-10-CM

## 2020-07-12 LAB — NOVEL CORONAVIRUS, NAA: SARS-CoV-2, NAA: NOT DETECTED

## 2020-07-12 LAB — SARS-COV-2, NAA 2 DAY TAT

## 2020-10-31 ENCOUNTER — Other Ambulatory Visit: Payer: Self-pay

## 2020-10-31 ENCOUNTER — Emergency Department (HOSPITAL_COMMUNITY)
Admission: EM | Admit: 2020-10-31 | Discharge: 2020-10-31 | Disposition: A | Discharge status: Home / Self Care | Payer: BC Managed Care – PPO | Attending: Emergency Medicine | Admitting: Emergency Medicine

## 2020-10-31 DIAGNOSIS — Z20822 Contact with and (suspected) exposure to covid-19: Secondary | ICD-10-CM | POA: Insufficient documentation

## 2020-10-31 DIAGNOSIS — Z79899 Other long term (current) drug therapy: Secondary | ICD-10-CM | POA: Insufficient documentation

## 2020-10-31 DIAGNOSIS — F17210 Nicotine dependence, cigarettes, uncomplicated: Secondary | ICD-10-CM | POA: Diagnosis not present

## 2020-10-31 DIAGNOSIS — M791 Myalgia, unspecified site: Secondary | ICD-10-CM | POA: Diagnosis not present

## 2020-10-31 DIAGNOSIS — L0291 Cutaneous abscess, unspecified: Secondary | ICD-10-CM

## 2020-10-31 DIAGNOSIS — I10 Essential (primary) hypertension: Secondary | ICD-10-CM | POA: Diagnosis not present

## 2020-10-31 DIAGNOSIS — R0981 Nasal congestion: Secondary | ICD-10-CM | POA: Diagnosis not present

## 2020-10-31 DIAGNOSIS — R519 Headache, unspecified: Secondary | ICD-10-CM | POA: Diagnosis present

## 2020-10-31 DIAGNOSIS — L02811 Cutaneous abscess of head [any part, except face]: Secondary | ICD-10-CM | POA: Diagnosis not present

## 2020-10-31 DIAGNOSIS — R509 Fever, unspecified: Secondary | ICD-10-CM

## 2020-10-31 DIAGNOSIS — R5081 Fever presenting with conditions classified elsewhere: Secondary | ICD-10-CM | POA: Diagnosis not present

## 2020-10-31 LAB — POC SARS CORONAVIRUS 2 AG -  ED: SARS Coronavirus 2 Ag: NEGATIVE

## 2020-10-31 MED ORDER — CLINDAMYCIN HCL 150 MG PO CAPS: 300.0000 mg | ORAL_CAPSULE | Freq: Three times a day (TID) | ORAL | 0 refills | Status: AC

## 2020-10-31 NOTE — ED Provider Notes (Signed)
[facility]-EMERGENCY DEPT Provider Note  CSN: [id number] Arrival date & time: 10/31/20 1700  Chief Complaint(s) Mass and Fever  HPI [facility] is a 40 y.o. female here with 2 days of fevers, chills, nasal congestion, headache and myalgias.  Reports known exposure at work to COVID-19.  Headache is attributed to lung on the side of her head which has gradually worsened over the past 2 days.  It is tender to palpation.  Pain is sharp and radiates to her left scalp when pressed.  Denies any injuries.  No nausea or vomiting.  No abdominal pain.  No other physical complaints.   The history is provided by the patient.    Past Medical History Past Medical History:  Diagnosis Date  . ADHD (attention deficit hyperactivity disorder)   . Dental caries   . Hypertension    There are no problems to display for this patient.  Home Medication(s) Prior to Admission medications   Medication Sig Start Date End Date Taking? Authorizing Provider  clindamycin (CLEOCIN) 150 MG capsule Take 2 capsules (300 mg total) by mouth 3 (three) times daily for 7 days. 10/31/20 11/07/20 Yes Kanye Depree, Amadeo Garnet, MD  benzonatate (TESSALON) 100 MG capsule Take 1 capsule (100 mg total) by mouth every 8 (eight) hours. 04/11/19   Caccavale, Sophia, PA-C  guaiFENesin (MUCINEX) 600 MG 12 hr tablet Take 1 tablet (600 mg total) by mouth 2 (two) times daily. 12/21/18   Couture, Cortni S, PA-C  methocarbamol (ROBAXIN) 500 MG tablet Take 1 tablet (500 mg total) by mouth 2 (two) times daily as needed for muscle spasms. 10/03/15   Everlene Farrier, PA-C  metroNIDAZOLE (FLAGYL) 500 MG tablet Take 1 tablet (500 mg total) by mouth 2 (two) times daily. 10/14/18   Eustace Moore, MD  naproxen (NAPROSYN) 250 MG tablet Take 1 tablet (250 mg total) by mouth 2 (two) times daily with a meal. 10/03/15   Everlene Farrier, PA-C  oxyCODONE-acetaminophen (PERCOCET/ROXICET) 5-325 MG per tablet Take 2 tablets by mouth every 4  (four) hours as needed for severe pain. 02/11/15   Patel-Mills, Lorelle Formosa, PA-C  potassium chloride (K-DUR) 10 MEQ tablet Take 2 tablets (20 mEq total) by mouth 2 (two) times daily. Take (4 tablets) tonight, followed by 2 tablets twice per day for 1 day. 12/09/16 12/11/16  Alvira Monday, MD                                                                                                                                    Past Surgical History Past Surgical History:  Procedure Laterality Date  . CESAREAN SECTION     Family History No family history on file.  Social History Social History   Tobacco Use  . Smoking status: Current Every Day Smoker  . Smokeless tobacco: Current User  Substance Use Topics  . Alcohol use: Yes  . Drug use: No   Allergies Patient  has no known allergies.  Review of Systems Review of Systems All other systems are reviewed and are negative for acute change except as noted in the HPI  Physical Exam Vital Signs  I have reviewed the triage vital signs BP (!) 168/121 (BP Location: Left Arm)   Pulse 75   Temp 98.3 F (36.8 C) (Oral)   Resp 16   Ht 5\' 6"  (1.676 m)   Wt 113.4 kg   SpO2 99%   BMI 40.35 kg/m   Physical Exam Vitals reviewed.  Constitutional:      General: She is not in acute distress.    Appearance: She is well-developed and well-nourished. She is not diaphoretic.  HENT:     Head: Normocephalic and atraumatic.      Nose: Rhinorrhea present.     Right Sinus: No frontal sinus tenderness.     Left Sinus: No frontal sinus tenderness.  Eyes:     General: No scleral icterus.       Right eye: No discharge.        Left eye: No discharge.     Extraocular Movements: EOM normal.     Conjunctiva/sclera: Conjunctivae normal.     Pupils: Pupils are equal, round, and reactive to light.  Cardiovascular:     Rate and Rhythm: Normal rate and regular rhythm.     Heart sounds: No murmur heard. No friction rub. No gallop.   Pulmonary:      Effort: Pulmonary effort is normal. No respiratory distress.     Breath sounds: Normal breath sounds. No stridor. No rales.  Abdominal:     General: There is no distension.     Palpations: Abdomen is soft.     Tenderness: There is no abdominal tenderness.  Musculoskeletal:        General: No tenderness or edema.     Cervical back: Normal range of motion and neck supple.  Skin:    General: Skin is warm and dry.     Findings: No erythema or rash.  Neurological:     Mental Status: She is alert and oriented to person, place, and time.  Psychiatric:        Mood and Affect: Mood and affect normal.     ED Results and Treatments Labs (all labs ordered are listed, but only abnormal results are displayed) Labs Reviewed  RESP PANEL BY RT-PCR (FLU A&B, COVID) ARPGX2  POC SARS CORONAVIRUS 2 AG -  ED                                                                                                                         EKG  EKG Interpretation  Date/Time:    Ventricular Rate:    PR Interval:    QRS Duration:   QT Interval:    QTC Calculation:   R Axis:     Text Interpretation:        Radiology No results found.  Pertinent labs & imaging results that  were available during my care of the patient were reviewed by me and considered in my medical decision making (see chart for details).  Medications Ordered in ED Medications - No data to display                                                                                                                                  Procedures Procedures  (including critical care time)  Medical Decision Making / ED Course I have reviewed the nursing notes for this encounter and the patient's prior records (if available in EHR or on provided paperwork).   [facility] was evaluated in Emergency Department on 11/01/2020 for the symptoms described in the history of present illness. She was evaluated in the context of the global COVID-19 pandemic,  which necessitated consideration that the patient might be at risk for infection with the SARS-CoV-2 virus that causes COVID-19. Institutional protocols and algorithms that pertain to the evaluation of patients at risk for COVID-19 are in a state of rapid change based on information released by regulatory bodies including the CDC and federal and state organizations. These policies and algorithms were followed during the patient's care in the ED.  Patient presents with viral symptoms for 2 days.  Adequate oral hydration. Rest of history as above.  Patient appears well. No signs of toxicity, patient is interactive. No hypoxia, tachypnea or other signs of respiratory distress. No sign of clinical dehydration. Lung exam clear. Rest of exam as above.  Most consistent with viral illness. COVID-19 and influenza negative  No evidence suggestive of pharyngitis, AOM, PNA, or meningitis.  Chest x-ray not indicated at this time.  Patient also has small indurated area likely ingrown hair.  No appreciable underlying fluctuance but likely early small abscess.  Will treat with oral antibiotics.  No I&D at this time.   Discussed symptomatic treatment with the patient and they will follow closely with their PCP.        Final Clinical Impression(s) / ED Diagnoses Final diagnoses:  Abscess  Subjective fever  Myalgia  Nasal congestion  Contact with and (suspected) exposure to covid-19   The patient appears reasonably screened and/or stabilized for discharge and I doubt any other medical condition or other [facility] requiring further screening, evaluation, or treatment in the ED at this time prior to discharge. Safe for discharge with strict return precautions.  Disposition: Discharge  Condition: Good  I have discussed the results, Dx and Tx plan with the patient/family who expressed understanding and agree(s) with the plan. Discharge instructions discussed at length. The patient/family was given strict  return precautions who verbalized understanding of the instructions. No further questions at time of discharge.    ED Discharge Orders         Ordered    clindamycin (CLEOCIN) 150 MG capsule  3 times daily        10/31/20 2319  Follow Up: Tracey Harries, MD [address] [place] [address] [phone]  Call  To schedule an appointment for close follow up      This chart was dictated using voice recognition software.  Despite best efforts to proofread,  errors can occur which can change the documentation meaning.   Nira Conn, MD 11/01/20 [phone]

## 2020-10-31 NOTE — ED Triage Notes (Signed)
Pt arrived via walk in, c/o lump to left side of head. Also c/o fevers, chills, headache. Known COVID exposure.

## 2020-11-01 LAB — RESP PANEL BY RT-PCR (FLU A&B, COVID) ARPGX2
Influenza A by PCR: NEGATIVE
Influenza B by PCR: NEGATIVE
SARS Coronavirus 2 by RT PCR: NEGATIVE

## 2020-11-09 ENCOUNTER — Encounter (HOSPITAL_COMMUNITY): Payer: Self-pay | Admitting: Emergency Medicine

## 2020-11-09 ENCOUNTER — Other Ambulatory Visit: Payer: Self-pay

## 2020-11-09 ENCOUNTER — Ambulatory Visit (HOSPITAL_COMMUNITY)
Admission: EM | Admit: 2020-11-09 | Discharge: 2020-11-09 | Disposition: A | Discharge status: Home / Self Care | Payer: BC Managed Care – PPO | Attending: Emergency Medicine | Admitting: Emergency Medicine

## 2020-11-09 DIAGNOSIS — U071 COVID-19: Secondary | ICD-10-CM | POA: Diagnosis not present

## 2020-11-09 DIAGNOSIS — R059 Cough, unspecified: Secondary | ICD-10-CM | POA: Diagnosis present

## 2020-11-09 DIAGNOSIS — J069 Acute upper respiratory infection, unspecified: Secondary | ICD-10-CM

## 2020-11-09 DIAGNOSIS — R6889 Other general symptoms and signs: Secondary | ICD-10-CM

## 2020-11-09 DIAGNOSIS — F17200 Nicotine dependence, unspecified, uncomplicated: Secondary | ICD-10-CM | POA: Insufficient documentation

## 2020-11-09 NOTE — Discharge Instructions (Signed)
Be sure to stay well hydrated, drinking enough fluids to keep your urine clear to pale yellow. You can alternate Tylenol and Ibuprofen for pain and fever.  CDC recommends isolation for 5 days from symptom onset and fever free for 24 hours without use of fever reducing medication and symptoms improving. Then wear a well fitting mask for 5 days when around others. If not improving, isolate for 10 days and consider reevaluation by a medical provider to rule out a secondary bacterial infection.   Call 911 or have someone drive you to the hospital if symptoms significantly worsening.

## 2020-11-09 NOTE — ED Triage Notes (Signed)
Patient c/o nonproductive cough, generalized muscle aches, nasal congestion, sinus pressure x 4 days.   Patient endorses chills at home.   Patient endorses that she's had a COVID exposure.   Patient has taken Theraflu at home for symptoms.

## 2020-11-09 NOTE — ED Provider Notes (Signed)
[facility]    CSN: [id number] Arrival date & time: 11/09/20  1325      History   Chief Complaint Chief Complaint  Patient presents with  . Cough  . Generalized Body Aches  . Nasal Congestion  . Facial Pain    HPI Nakyah Erdmann is a 40 y.o. female.   HPI  Sherley Tomei is a 40 y.o. female presenting to UC with c/o nonproductive cough, generalized body aches, nasal congestion and sinus pressure for 4 days. States she has been exposed to COVID. Last COVID vaccine was in May 2021. Denies recorded fever but reports hot flashes. Denies n/v/d. No chest pain or SOB.  Past Medical History:  Diagnosis Date  . ADHD (attention deficit hyperactivity disorder)   . Dental caries   . Hypertension     There are no problems to display for this patient.   Past Surgical History:  Procedure Laterality Date  . CESAREAN SECTION      OB History   No obstetric history on file.      Home Medications    Prior to Admission medications   Medication Sig Start Date End Date Taking? Authorizing Provider  benzonatate (TESSALON) 100 MG capsule Take 1 capsule (100 mg total) by mouth every 8 (eight) hours. 04/11/19   Caccavale, Sophia, PA-C  guaiFENesin (MUCINEX) 600 MG 12 hr tablet Take 1 tablet (600 mg total) by mouth 2 (two) times daily. 12/21/18   Couture, Cortni S, PA-C  methocarbamol (ROBAXIN) 500 MG tablet Take 1 tablet (500 mg total) by mouth 2 (two) times daily as needed for muscle spasms. 10/03/15   Everlene Farrier, PA-C  metroNIDAZOLE (FLAGYL) 500 MG tablet Take 1 tablet (500 mg total) by mouth 2 (two) times daily. 10/14/18   Eustace Moore, MD  naproxen (NAPROSYN) 250 MG tablet Take 1 tablet (250 mg total) by mouth 2 (two) times daily with a meal. 10/03/15   Everlene Farrier, PA-C  oxyCODONE-acetaminophen (PERCOCET/ROXICET) 5-325 MG per tablet Take 2 tablets by mouth every 4 (four) hours as needed for severe pain. 02/11/15   Patel-Mills, Lorelle Formosa, PA-C  potassium chloride  (K-DUR) 10 MEQ tablet Take 2 tablets (20 mEq total) by mouth 2 (two) times daily. Take (4 tablets) tonight, followed by 2 tablets twice per day for 1 day. 12/09/16 12/11/16  Alvira Monday, MD    Family History History reviewed. No pertinent family history.  Social History Social History   Tobacco Use  . Smoking status: Current Every Day Smoker  . Smokeless tobacco: Current User  Substance Use Topics  . Alcohol use: Yes  . Drug use: No     Allergies   Patient has no known allergies.   Review of Systems Review of Systems  Constitutional: Positive for fever (subjective). Negative for chills.  HENT: Positive for congestion and sinus pressure. Negative for ear pain, sore throat, trouble swallowing and voice change.   Respiratory: Positive for cough. Negative for shortness of breath.   Cardiovascular: Negative for chest pain and palpitations.  Gastrointestinal: Negative for abdominal pain, diarrhea, nausea and vomiting.  Musculoskeletal: Positive for arthralgias, back pain and myalgias.  Skin: Negative for rash.  Neurological: Positive for headaches. Negative for dizziness and light-headedness.  All other systems reviewed and are negative.    Physical Exam Triage Vital Signs ED Triage Vitals  Enc Vitals Group     BP 11/09/20 1515 (!) 144/100     Pulse Rate 11/09/20 1515 87     Resp 11/09/20  1515 16     Temp 11/09/20 1515 97.8 F (36.6 C)     Temp Source 11/09/20 1515 Oral     SpO2 11/09/20 1515 100 %     Weight 11/09/20 1511 210 lb (95.3 kg)     Height 11/09/20 1511 5\' 6"  (1.676 m)     Head Circumference --      Peak Flow --      Pain Score 11/09/20 1511 6     Pain Loc --      Pain Edu? --      Excl. in GC? --    No data found.  Updated Vital Signs BP (!) 144/100 (BP Location: Right Arm)   Pulse 87   Temp 97.8 F (36.6 C) (Oral)   Resp 16   Ht 5\' 6"  (1.676 m)   Wt 210 lb (95.3 kg)   LMP 10/27/2020   SpO2 100%   BMI 33.89 kg/m   Visual  Acuity Right Eye Distance:   Left Eye Distance:   Bilateral Distance:    Right Eye Near:   Left Eye Near:    Bilateral Near:     Physical Exam Vitals and nursing note reviewed.  Constitutional:      General: She is not in acute distress.    Appearance: Normal appearance. She is well-developed and well-nourished. She is not ill-appearing, toxic-appearing or diaphoretic.  HENT:     Head: Normocephalic and atraumatic.     Right Ear: Tympanic membrane and ear canal normal.     Left Ear: Tympanic membrane and ear canal normal.     Nose: Nose normal.     Right Sinus: No maxillary sinus tenderness or frontal sinus tenderness.     Left Sinus: No maxillary sinus tenderness or frontal sinus tenderness.     Mouth/Throat:     Lips: Pink.     Mouth: Mucous membranes are moist.     Pharynx: Oropharynx is clear. Uvula midline. No pharyngeal swelling, oropharyngeal exudate, posterior oropharyngeal erythema or uvula swelling.  Eyes:     Extraocular Movements: EOM normal.  Cardiovascular:     Rate and Rhythm: Normal rate and regular rhythm.  Pulmonary:     Effort: Pulmonary effort is normal. No respiratory distress.     Breath sounds: Normal breath sounds. No stridor. No wheezing, rhonchi or rales.  Musculoskeletal:        General: Normal range of motion.     Cervical back: Normal range of motion and neck supple. No tenderness.  Lymphadenopathy:     Cervical: No cervical adenopathy.  Skin:    General: Skin is warm and dry.  Neurological:     Mental Status: She is alert and oriented to person, place, and time.  Psychiatric:        Mood and Affect: Mood and affect normal.        Behavior: Behavior normal.      UC Treatments / Results  Labs (all labs ordered are listed, but only abnormal results are displayed) Labs Reviewed  SARS CORONAVIRUS 2 (TAT 6-24 HRS)    EKG   Radiology No results found.  Procedures Procedures (including critical care time)  Medications Ordered in  UC Medications - No data to display  Initial Impression / Assessment and Plan / UC Course  I have reviewed the triage vital signs and the nursing notes.  Pertinent labs & imaging results that were available during my care of the patient were reviewed by me and considered in  my medical decision making (see chart for details).     No evidence of bacterial infection at this time. COVID pending Encouraged symptomatic tx F/u with PCP as needed AVS given  Final Clinical Impressions(s) / UC Diagnoses   Final diagnoses:  Flu-like symptoms  Viral URI with cough     Discharge Instructions     Be sure to stay well hydrated, drinking enough fluids to keep your urine clear to pale yellow. You can alternate Tylenol and Ibuprofen for pain and fever.  CDC recommends isolation for 5 days from symptom onset and fever free for 24 hours without use of fever reducing medication and symptoms improving. Then wear a well fitting mask for 5 days when around others. If not improving, isolate for 10 days and consider reevaluation by a medical provider to rule out a secondary bacterial infection.   Call 911 or have someone drive you to the hospital if symptoms significantly worsening.     ED Prescriptions    None     PDMP not reviewed this encounter.   Lurene Shadow, [place] 11/09/20 1741

## 2020-11-10 LAB — SARS CORONAVIRUS 2 (TAT 6-24 HRS): SARS Coronavirus 2: POSITIVE — AB
